# Patient Record
Sex: Male | Born: 1970 | Race: White | Hispanic: No | Marital: Married | Smoking: Current every day smoker
Health system: Southern US, Community
[De-identification: ages and names within clinical notes are randomized; demographics above are authoritative.]

## PROBLEM LIST (undated history)

## (undated) DIAGNOSIS — K219 Gastro-esophageal reflux disease without esophagitis: Secondary | ICD-10-CM

## (undated) DIAGNOSIS — E119 Type 2 diabetes mellitus without complications: Secondary | ICD-10-CM

## (undated) DIAGNOSIS — Z87442 Personal history of urinary calculi: Secondary | ICD-10-CM

## (undated) DIAGNOSIS — L508 Other urticaria: Secondary | ICD-10-CM

## (undated) DIAGNOSIS — R609 Edema, unspecified: Secondary | ICD-10-CM

## (undated) DIAGNOSIS — I10 Essential (primary) hypertension: Secondary | ICD-10-CM

## (undated) HISTORY — PX: SHOULDER ARTHROSCOPY: SHX128

## (undated) HISTORY — PX: ABDOMINAL SURGERY: SHX537

---
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
## 2004-04-25 ENCOUNTER — Emergency Department: Payer: Self-pay | Admitting: Emergency Medicine

## 2004-08-07 ENCOUNTER — Emergency Department: Payer: Self-pay | Admitting: Emergency Medicine

## 2005-04-23 ENCOUNTER — Emergency Department: Payer: Self-pay | Admitting: Unknown Physician Specialty

## 2005-11-14 ENCOUNTER — Other Ambulatory Visit: Payer: Self-pay

## 2005-11-14 ENCOUNTER — Emergency Department: Payer: Self-pay | Admitting: Emergency Medicine

## 2006-03-18 ENCOUNTER — Other Ambulatory Visit: Payer: Self-pay

## 2006-03-18 ENCOUNTER — Emergency Department: Payer: Self-pay | Admitting: Emergency Medicine

## 2006-11-17 ENCOUNTER — Emergency Department: Payer: Self-pay | Admitting: Internal Medicine

## 2007-03-02 ENCOUNTER — Emergency Department: Payer: Self-pay | Admitting: Emergency Medicine

## 2007-11-30 ENCOUNTER — Emergency Department: Payer: Self-pay | Admitting: Emergency Medicine

## 2007-11-30 ENCOUNTER — Other Ambulatory Visit: Payer: Self-pay

## 2007-12-11 ENCOUNTER — Ambulatory Visit: Payer: Self-pay | Admitting: Surgery

## 2008-01-10 ENCOUNTER — Ambulatory Visit: Payer: Self-pay | Admitting: Family Medicine

## 2008-02-29 ENCOUNTER — Ambulatory Visit: Payer: Self-pay | Admitting: Internal Medicine

## 2008-05-02 ENCOUNTER — Ambulatory Visit: Payer: Self-pay | Admitting: Family Medicine

## 2008-06-17 HISTORY — PX: SHOULDER ARTHROSCOPY: SHX128

## 2009-01-26 ENCOUNTER — Ambulatory Visit (HOSPITAL_BASED_OUTPATIENT_CLINIC_OR_DEPARTMENT_OTHER): Admission: RE | Admit: 2009-01-26 | Discharge: 2009-01-27 | Payer: Self-pay | Admitting: Orthopedic Surgery

## 2010-02-05 ENCOUNTER — Emergency Department: Payer: Self-pay | Admitting: Emergency Medicine

## 2010-03-25 ENCOUNTER — Emergency Department: Payer: Self-pay | Admitting: Emergency Medicine

## 2010-04-22 ENCOUNTER — Emergency Department: Payer: Self-pay | Admitting: Emergency Medicine

## 2010-09-05 ENCOUNTER — Ambulatory Visit: Payer: Self-pay | Admitting: Family Medicine

## 2010-09-23 LAB — POCT HEMOGLOBIN-HEMACUE: Hemoglobin: 14.8 g/dL (ref 13.0–17.0)

## 2010-10-30 NOTE — Op Note (Signed)
NAMEJUSITN, SALSGIVER NO.:  [id number]   MEDICAL RECORD NO.:  [id number]          PATIENT TYPE:  AMB   LOCATION:  DSC                          FACILITY:  [facility]   PHYSICIAN:  Katy Fitch. Sypher, M.D. DATE OF BIRTH:  Dec 24, 1970   DATE OF PROCEDURE:  01/26/2009  DATE OF DISCHARGE:                               OPERATIVE REPORT   PREOPERATIVE DIAGNOSIS:  Chronic pain syndrome right upper extremity  status post fall and alleged posterior dislocation right shoulder  sustained August 09, 2008 with persistent numbness, weakness and  mechanical symptoms of possible rotator cuff tear/instability right  shoulder.   POSTOPERATIVE DIAGNOSES:  Partial bursal side tear due to chronic stage  II impingement right shoulder and apparent healing of articular side  tear of supraspinatus tendon documented on preoperative MRI dated October 06, 2008.  Also acromioclavicular separation with acromioclavicular  arthropathy and prominent medial acromial and distal clavicle  osteophytes.   OPERATION:  1. Examination of right shoulder under anesthesia demonstrating      capsuloligamentous stability.  2. Diagnostic arthroscopy confirming intact labrum and      capsuloligamentous structures as well as healed articular side tear      of supraspinatus rotator cuff tendon and intact biceps labral      complex.  3. Arthroscopic subacromial decompression with bursectomy, debridement      of grade 2 impingement related partial-thickness rotator cuff tear      of supraspinatus anteriorly and medial acromioplasty.  4. Arthroscopic resection of distal clavicle.   SURGEON:  Katy Fitch. Sypher, MD   ASSISTANT:  Annye Rusk, PA-C   ANESTHESIA:  General endotracheal supplemented by right interscalene  block.   SUPERVISED ANESTHESIOLOGIST:  Janetta Hora. 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   On August 09, 2008 he lost his balance on a ladder fell approximately  15 feet while repairing damage to a gutter catching his full weight on  the right hand and applying a traction type strain to the right  shoulder.  He had immediate pain and was brought to the [facility]gent [facility] in [place] where the clinician on call for the Urgent Care  Center identified a posterior dislocation of the right shoulder.   After sedation and pain medication, Mr. Samples underwent a closed  reduction maneuver and was placed in a sling.  He was advised to  followup with his orthopedic surgeon in [place], [place].   He subsequently saw Dr. Boyce Medici who initially treated him for pain  and initiated a therapy program.   Mr. Severs continued to complain of numbness, therefore,  electrodiagnostic studies were obtained by Dr. Thomasena Edis of [facility].  A detailed EMG of the right and left upper  extremities revealed no sign of brachial plexopathy or abnormal muscle  activity suggestive of radiculopathy.  Nerve conduction studies  demonstrated evidence of bilateral carpal tunnel syndrome.   Dr. Thomasena Edis report is a bit difficult to interpret in that a  final  summary was not transcribed or at least was not referred to the [facility]  Compensation entity.   With persistent shoulder pain, Dr. Kennith Center ordered an MRI of the right  shoulder on October 06, 2008.  This was interpreted by the attending  radiologist to reveal a partial undersurface tear of the supraspinatus  tendon without retracted full-thickness tear and edema at the [facility] joint  ligamentous thickening and likely a grade 2 separation.   Mr. Beckner has been unable to return to work after 6 months and has had  persistent pain including numbness, tingling and weakness of the right  upper extremity.  On January 16, 2009 an independent medical evaluation  was requested regarding his persistent right upper extremity pain.   Clinical  examination was remarkable for a popping that was both palpable  and audible with abduction of the shoulder through 80-120 degrees  beneath the acromion and coracoacromial ligament that was likely  originating at the [facility] joint.  Plain films and an MRI of the shoulder  were reviewed in detail.  The [facility] joint was widened with degenerative  change and degenerative osteophyte formation at the medial acromion and  distal clavicle.  There were clearly mechanical symptoms with strain of  the supraspinatus, infraspinatus and rotator cuff tendons.   In light of the 54-month history of pain, mechanical popping and the  abnormal appearance of the [facility] joint, we recommended proceeding with  diagnostic arthroscopy.   A secondary goal of the arthroscopy would be to confirm the  capsuloligamentous structures following the alleged posterior  dislocation.   We advised Mr. Acuna to proceed with diagnostic arthroscopy anticipating  subacromial decompression, clavicle resection, and possible repair of  partial-thickness rotator cuff tear.  Also in our preoperative  discussion we advised Mr. Keir that he had features of neuropathic pain  and atypical pain that may have absolutely no therapeutic response to a  diagnostic arthroscopy of the shoulder.   He understands that we may need to have him evaluated by a pain  management specialist.  We may need to have him on medication for  neuropathic pain and might require further diagnostic efforts.   Our goal at this time was to address his mechanical shoulder symptoms,  as well as signs of a partial rotator cuff tear.  After informed  consent, he was brought to the operating room at this time.  Preoperatively, he was interviewed by Dr. Gelene Mink who recommended an  interscalene block.  This was placed without complication.   PROCEDURE:  BJORN HALLAS was brought to the operating room and placed in  supine position upon the operating table.  Following a proper  site  identification and protocol in the holding area, he was transferred to  the operating table where under Dr. Thornton Dales direct supervision  general endotracheal anesthesia was induced.  He was carefully  positioned in the beach-chair position with aid of a torso and head  holder designed for shoulder arthroscopy.  Under general anesthesia, I  tested his shoulder in all planes of motion.  He had combined elevation  175 degrees, external rotation 95 degrees, internal rotation 80.  He was  stable to anterior posterior, superior inferior traction and  translational stress.  He did not appear to have subluxing shoulder.   The right upper extremity was then prepped with DuraPrep and draped with  impervious arthroscopy drapes.  The shoulder was distended with 20 mL of  sterile saline and the scope placed through a  standard posterior viewing  portal with blunt technique.  Diagnostic arthroscopy revealed a pristine  anterior labrum and capsuloligamentous structures.  The biceps origin  was stable to superior labrum.  The biceps tendon was normal through the  rotator interval.  The subscapularis had a slight bulge at the superior  fibers that could have represented a small degenerative tear, however,  there was no sign of a significant retracted subscapularis rotator cuff  tear.  The articular surface of the supraspinatus, infraspinatus, and  teres minor was inspected and found to be pristine.  The findings noted  on the MRI in April appeared to have healed on the articular side.   The hyaline cartilage surfaces of the glenoid and humeral head were  normal.   The scope was removed from glenohumeral joint and placed in subacromial  space.  There was a moderate degree of hypertrophic bursitis noted.  The  [facility] joint was very prominent with a thickened and exophytic capsule.  The  supraspinatus tendon had a swayed like roughening of its surface and  partial tearing due to chronic abrasion  beneath the [facility] joint and  osteophytes.  This was likely the source of the palpable crepitation  with elevation and abduction movement.   The suction shaver was used to clear the bursa off the acromion and  distal clavicle followed by use of the cutting cautery to release the  capsule.  A suction bur was used to remove the redundant capsule  followed by use of a suction bur to resect the medial acromial  osteophyte converting the acromion to a level type 1 morphology and the  distal 6-7 mm clavicle was removed from inferior to superior followed by  undersurface tailoring of the clavicle to a smooth chord.   We relieved the site of possible impingement at the distal clavicle and  medial acromion followed by continued bursectomy.  The redundant and  rough margins of the supraspinatus tendon were smoothed with a 4.5-mm  suction shaver.   Hemostasis was achieved with bipolar cautery.   I did not identify any evidence of a full-thickness rotator cuff tear.   The arthroscopic equipment was removed and the portals repaired.  Mr.  Pfeifer was placed in a sling and transferred to the recovery room with  stable signs.   We will begin immediate range of motion exercises now that we understand  his stable joint and limited rotator cuff pathology.   If his pain syndrome persists, we would engage in a workup and  management program for neuropathic pain.   There were no apparent complications.      Katy Fitch Sypher, M.D.  Electronically Signed     RVS/MEDQ  D:  01/26/2009  T:  01/27/2009  Job:  [id number]

## 2010-11-20 ENCOUNTER — Emergency Department: Payer: Self-pay | Admitting: Emergency Medicine

## 2011-05-19 ENCOUNTER — Emergency Department: Payer: Self-pay | Admitting: *Deleted

## 2012-01-18 ENCOUNTER — Emergency Department: Payer: Self-pay | Admitting: *Deleted

## 2012-01-18 LAB — CBC
HCT: 46.9 % (ref 40.0–52.0)
HGB: 16 g/dL (ref 13.0–18.0)
MCH: 30.5 pg (ref 26.0–34.0)
MCHC: 34.1 g/dL (ref 32.0–36.0)
MCV: 89 fL (ref 80–100)
Platelet: 305 10*3/uL (ref 150–440)
RBC: 5.24 10*6/uL (ref 4.40–5.90)
RDW: 14.1 % (ref 11.5–14.5)
WBC: 12.6 10*3/uL — ABNORMAL HIGH (ref 3.8–10.6)

## 2012-01-18 LAB — URINALYSIS, COMPLETE
Bacteria: NONE SEEN
Bilirubin,UR: NEGATIVE
Glucose,UR: NEGATIVE mg/dL (ref 0–75)
Hyaline Cast: 2
Ketone: NEGATIVE
Leukocyte Esterase: NEGATIVE
Nitrite: NEGATIVE
Ph: 7 (ref 4.5–8.0)
Protein: 30
RBC,UR: 844 /HPF (ref 0–5)
Specific Gravity: 1.017 (ref 1.003–1.030)
Squamous Epithelial: <1
WBC UR: 7 /HPF (ref 0–5)

## 2012-01-18 LAB — COMPREHENSIVE METABOLIC PANEL
Albumin: 4.1 g/dL (ref 3.4–5.0)
Alkaline Phosphatase: 110 U/L (ref 50–136)
Anion Gap: 11 (ref 7–16)
BUN: 18 mg/dL (ref 7–18)
Bilirubin,Total: 0.3 mg/dL (ref 0.2–1.0)
Calcium, Total: 9.1 mg/dL (ref 8.5–10.1)
Chloride: 104 mmol/L (ref 98–107)
Co2: 24 mmol/L (ref 21–32)
Creatinine: 1.48 mg/dL — ABNORMAL HIGH (ref 0.60–1.30)
EGFR (African American): >60
EGFR (Non-African Amer.): 58 — ABNORMAL LOW
Glucose: 112 mg/dL — ABNORMAL HIGH (ref 65–99)
Osmolality: 280 (ref 275–301)
Potassium: 3.4 mmol/L — ABNORMAL LOW (ref 3.5–5.1)
SGOT(AST): 25 U/L (ref 15–37)
SGPT (ALT): 21 U/L (ref 12–78)
Sodium: 139 mmol/L (ref 136–145)
Total Protein: 8.2 g/dL (ref 6.4–8.2)

## 2012-01-18 LAB — LIPASE, BLOOD: Lipase: 106 U/L (ref 73–393)

## 2012-05-16 ENCOUNTER — Emergency Department: Payer: Self-pay | Admitting: Unknown Physician Specialty

## 2012-06-30 ENCOUNTER — Emergency Department: Payer: Self-pay | Admitting: Emergency Medicine

## 2013-01-13 ENCOUNTER — Emergency Department: Payer: Self-pay | Admitting: Emergency Medicine

## 2013-05-15 ENCOUNTER — Emergency Department: Payer: Self-pay | Admitting: Emergency Medicine

## 2013-08-18 DIAGNOSIS — L509 Urticaria, unspecified: Secondary | ICD-10-CM | POA: Insufficient documentation

## 2013-08-18 DIAGNOSIS — L508 Other urticaria: Secondary | ICD-10-CM | POA: Insufficient documentation

## 2013-08-18 DIAGNOSIS — F17200 Nicotine dependence, unspecified, uncomplicated: Secondary | ICD-10-CM | POA: Insufficient documentation

## 2013-08-18 HISTORY — DX: Nicotine dependence, unspecified, uncomplicated: F17.200

## 2014-08-16 ENCOUNTER — Emergency Department: Payer: Self-pay | Admitting: Emergency Medicine

## 2015-04-11 DIAGNOSIS — I10 Essential (primary) hypertension: Secondary | ICD-10-CM | POA: Insufficient documentation

## 2018-10-16 ENCOUNTER — Other Ambulatory Visit: Payer: Self-pay

## 2018-10-16 DIAGNOSIS — M25562 Pain in left knee: Secondary | ICD-10-CM | POA: Insufficient documentation

## 2018-10-16 DIAGNOSIS — Y990 Civilian activity done for income or pay: Secondary | ICD-10-CM | POA: Insufficient documentation

## 2018-10-16 DIAGNOSIS — Y9289 Other specified places as the place of occurrence of the external cause: Secondary | ICD-10-CM | POA: Insufficient documentation

## 2018-10-16 DIAGNOSIS — X500XXA Overexertion from strenuous movement or load, initial encounter: Secondary | ICD-10-CM | POA: Diagnosis not present

## 2018-10-16 DIAGNOSIS — M25462 Effusion, left knee: Secondary | ICD-10-CM | POA: Insufficient documentation

## 2018-10-16 DIAGNOSIS — Z79899 Other long term (current) drug therapy: Secondary | ICD-10-CM | POA: Diagnosis not present

## 2018-10-16 DIAGNOSIS — Y9302 Activity, running: Secondary | ICD-10-CM | POA: Insufficient documentation

## 2018-10-16 DIAGNOSIS — F17200 Nicotine dependence, unspecified, uncomplicated: Secondary | ICD-10-CM | POA: Diagnosis not present

## 2018-10-17 ENCOUNTER — Emergency Department: Payer: Worker's Compensation

## 2018-10-17 ENCOUNTER — Emergency Department
Admission: EM | Admit: 2018-10-17 | Discharge: 2018-10-17 | Disposition: A | Discharge status: Home / Self Care | Payer: Worker's Compensation | Attending: Student in an Organized Health Care Education/Training Program | Admitting: Student in an Organized Health Care Education/Training Program

## 2018-10-17 ENCOUNTER — Other Ambulatory Visit: Payer: Self-pay

## 2018-10-17 ENCOUNTER — Encounter: Payer: Self-pay | Admitting: Emergency Medicine

## 2018-10-17 DIAGNOSIS — M25562 Pain in left knee: Secondary | ICD-10-CM

## 2018-10-17 DIAGNOSIS — M25462 Effusion, left knee: Secondary | ICD-10-CM

## 2018-10-17 HISTORY — DX: Other urticaria: L50.8

## 2018-10-17 HISTORY — DX: Edema, unspecified: R60.9

## 2018-10-17 MED ORDER — HYDROCODONE-ACETAMINOPHEN 5-325 MG PO TABS
1.0000 | ORAL_TABLET | Freq: Once | ORAL | Status: AC
  Administered 2018-10-17: 1 via ORAL
  Filled 2018-10-17: qty 1

## 2018-10-17 MED ORDER — HYDROCODONE-ACETAMINOPHEN 5-325 MG PO TABS: 1.0000 | ORAL_TABLET | ORAL | 0 refills | Status: DC | PRN

## 2018-10-17 NOTE — ED Notes (Signed)
Pt updated on wait time and is understanding.  

## 2018-10-17 NOTE — ED Triage Notes (Signed)
Pt says he twisted his left knee while running from a hornet at his job today; pain with any movement or ambulation; swelling present;

## 2018-10-17 NOTE — ED Provider Notes (Signed)
[facility] Emergency Department Provider Note    First MD Initiated Contact with Patient 10/17/18 [phone]     (approximate)  I have reviewed the triage vital signs and the nursing notes.   HISTORY  Chief Complaint Knee Pain    HPI Perry Hansen is a 48 y.o. male below listed past medical history presents the ER for evaluation of acute left knee pain that occurred today while he was at work.  States he was running away from a hornet's nest and when making a turn while running felt and heard a pop in his left knee with sudden onset moderate to severe pain.  Is he went home was able to hobble on the left knee for the rest of the day but did have pain with weightbearing.  Denies any numbness or tingling.  States he feels like it is little bit more swollen.  No fevers.  Past Medical History:  Diagnosis Date  . Chronic urticaria   . Edema    History reviewed. No pertinent family history. Past Surgical History:  Procedure Laterality Date  . ABDOMINAL SURGERY    . SHOULDER ARTHROSCOPY Right    There are no active problems to display for this patient.     Prior to Admission medications   Medication Sig Start Date End Date Taking? Authorizing Provider  hydrochlorothiazide (HYDRODIURIL) 25 MG tablet Take 25 mg by mouth daily.   Yes [provider]  predniSONE (DELTASONE) 10 MG tablet Take 10 mg by mouth daily with breakfast.   Yes [provider]  HYDROcodone-acetaminophen (NORCO) 5-325 MG tablet Take 1 tablet by mouth every 4 (four) hours as needed for moderate pain. 10/17/18   Willy Eddy, MD    Allergies Naproxen    Social History Social History   Tobacco Use  . Smoking status: Current Every Day Smoker  . Smokeless tobacco: Never Used  Substance Use Topics  . Alcohol use: Yes  . Drug use: Never    Review of Systems Patient denies headaches, rhinorrhea, blurry vision, numbness, shortness of breath, chest pain, edema,  cough, abdominal pain, nausea, vomiting, diarrhea, dysuria, fevers, rashes or hallucinations unless otherwise stated above in HPI. ____________________________________________   PHYSICAL EXAM:  VITAL SIGNS: Vitals:   10/17/18 0014 10/17/18 0350  BP: (!) 182/88 (!) 181/94  Pulse: 81 73  Resp: 18 17  Temp: 97.9 F (36.6 C) 98 F (36.7 C)  SpO2: 97% 97%    Constitutional: Alert and oriented. Well appearing and in no acute distress. Eyes: Conjunctivae are normal.  Head: Atraumatic. Nose: No congestion/rhinnorhea. Mouth/Throat: Mucous membranes are moist.   Neck: Painless ROM.  Cardiovascular:   Good peripheral circulation. Respiratory: Normal respiratory effort.  No retractions.  Gastrointestinal: Soft and nontender.  Musculoskeletal: Trace left knee effusion.  Able to hold leg up in extension against gravity.  No overlying erythema laceration neurovascularly intact distally.  There is pain reproduced with valgus and varus stress testing.  No overlying crepitus. Neurologic:  Normal speech and language. No gross focal neurologic deficits are appreciated.  Skin:  Skin is warm, dry and intact. No rash noted. Psychiatric: Mood and affect are normal. Speech and behavior are normal.  ____________________________________________   LABS (all labs ordered are listed, but only abnormal results are displayed)  No results found for this or any previous visit (from the past 24 hour(s)). ____________________________________________ ____________________________________________  RADIOLOGY  I personally reviewed all radiographic images ordered to evaluate for the above acute complaints and reviewed  radiology reports and findings.  These findings were personally discussed with the patient.  Please see medical record for radiology report.  ____________________________________________   PROCEDURES  Procedure(s) performed:  Procedures    Critical Care performed: no  ____________________________________________   INITIAL IMPRESSION / ASSESSMENT AND PLAN / ED COURSE  Pertinent labs & imaging results that were available during my care of the patient were reviewed by me and considered in my medical decision making (see chart for details).  DDX: fracture, dislocation, contusion, ligamentous injury, meniscal injury  Perry Hansen is a 48 y.o. who presents to the ED with acute left knee injury. Denies any other injuries. Denies motor or sensory loss. Able to bear weight. VSS in ED. Exam as above. NV intact throughout and distal to injury. Pt able to range joint. No ligament laxity on exam. No clinical suspicion for infectious process or septic joint. X-rays w/o fracture. No other injuries reported or noted on exam.. Discussed supportive care, crutches, nonweightbearing, immobilizer and follow up with pt.  The patient was evaluated in Emergency Department today for the symptoms described in the history of present illness. He/she was evaluated in the context of the global COVID-19 pandemic, which necessitated consideration that the patient might be at risk for infection with the SARS-CoV-2 virus that causes COVID-19. Institutional protocols and algorithms that pertain to the evaluation of patients at risk for COVID-19 are in a state of rapid change based on information released by regulatory bodies including the CDC and federal and state organizations. These policies and algorithms were followed during the patient's care in the ED.      ____________________________________________   FINAL CLINICAL IMPRESSION(S) / ED DIAGNOSES  Final diagnoses:  Acute pain of left knee  Effusion of left knee      NEW MEDICATIONS STARTED DURING THIS VISIT:  New Prescriptions   HYDROCODONE-ACETAMINOPHEN (NORCO) 5-325 MG TABLET    Take 1 tablet by mouth every 4 (four) hours as needed for moderate pain.     Note:  This document was prepared using Dragon voice recognition  software and may include unintentional dictation errors.     Willy Eddyobinson, Bailynn Dyk, MD 10/17/18 [phone]

## 2019-01-11 ENCOUNTER — Emergency Department: Payer: Worker's Compensation

## 2019-01-11 ENCOUNTER — Emergency Department
Admission: EM | Admit: 2019-01-11 | Discharge: 2019-01-11 | Disposition: A | Discharge status: Home / Self Care | Payer: Worker's Compensation | Attending: Emergency Medicine | Admitting: Emergency Medicine

## 2019-01-11 ENCOUNTER — Encounter: Payer: Self-pay | Admitting: Emergency Medicine

## 2019-01-11 ENCOUNTER — Other Ambulatory Visit: Payer: Self-pay

## 2019-01-11 DIAGNOSIS — W010XXA Fall on same level from slipping, tripping and stumbling without subsequent striking against object, initial encounter: Secondary | ICD-10-CM | POA: Insufficient documentation

## 2019-01-11 DIAGNOSIS — M79662 Pain in left lower leg: Secondary | ICD-10-CM | POA: Insufficient documentation

## 2019-01-11 DIAGNOSIS — M25562 Pain in left knee: Secondary | ICD-10-CM

## 2019-01-11 DIAGNOSIS — M79652 Pain in left thigh: Secondary | ICD-10-CM | POA: Insufficient documentation

## 2019-01-11 DIAGNOSIS — F17200 Nicotine dependence, unspecified, uncomplicated: Secondary | ICD-10-CM | POA: Insufficient documentation

## 2019-01-11 MED ORDER — OXYCODONE-ACETAMINOPHEN 5-325 MG PO TABS
1.0000 | ORAL_TABLET | Freq: Once | ORAL | Status: AC
  Administered 2019-01-11: 1 via ORAL
  Filled 2019-01-11: qty 1

## 2019-01-11 MED ORDER — OXYCODONE-ACETAMINOPHEN 5-325 MG PO TABS: 1.0000 | ORAL_TABLET | ORAL | 0 refills | Status: DC | PRN

## 2019-01-11 NOTE — ED Triage Notes (Signed)
Pt arrives via [facility] with c/o mechanical fall. Pt has a torn ACL in left leg and heard a pop. EMS reports VS WDL. Pt is in NAD.

## 2019-01-11 NOTE — ED Provider Notes (Signed)
[facility] Emergency Department Provider Note __   First MD Initiated Contact with Patient 01/11/19 0411     (approximate)  I have reviewed the triage vital signs and the nursing notes.   HISTORY  Chief Complaint Fall    HPI Perry Hansen is a 48 y.o. male presents emergency department via EMS after accidental fall with resultant left leg pain.  Patient has a known left knee ACL "complete tear" for which the patient is awaiting surgery and currently wearing a knee immobilizer.  Patient states that while walking with crutches tonight the crutches "came out from under me resulting the patient falling.  Patient admits to left knee/lateral thigh pain.  Patient states that current pain score 7 out of 10 worse with any movement.       Past Medical History:  Diagnosis Date  . Chronic urticaria   . Edema     There are no active problems to display for this patient.   Past Surgical History:  Procedure Laterality Date  . ABDOMINAL SURGERY    . SHOULDER ARTHROSCOPY Right     Prior to Admission medications   Medication Sig Start Date End Date Taking? Authorizing Provider  hydrochlorothiazide (HYDRODIURIL) 25 MG tablet Take 25 mg by mouth daily.    [provider]  HYDROcodone-acetaminophen (NORCO) 5-325 MG tablet Take 1 tablet by mouth every 4 (four) hours as needed for moderate pain. 10/17/18   Merlyn Lot, MD  predniSONE (DELTASONE) 10 MG tablet Take 10 mg by mouth daily with breakfast.    [provider]    Allergies Naproxen  No family history on file.  Social History Social History   Tobacco Use  . Smoking status: Current Every Day Smoker  . Smokeless tobacco: Never Used  Substance Use Topics  . Alcohol use: Yes  . Drug use: Never    Review of Systems Constitutional: No fever/chills Eyes: No visual changes. ENT: No sore throat. Cardiovascular: Denies chest pain. Respiratory: Denies shortness of breath.  Gastrointestinal: No abdominal pain.  No nausea, no vomiting.  No diarrhea.  No constipation. Genitourinary: Negative for dysuria. Musculoskeletal:  Positive for left thigh/knee pain  integumentary: Negative for rash. Neurological: Negative for headaches, focal weakness or numbness.   ____________________________________________   PHYSICAL EXAM:  VITAL SIGNS: ED Triage Vitals  Enc Vitals Group     BP 01/11/19 0409 (!) 152/100     Pulse Rate 01/11/19 0409 86     Resp 01/11/19 0409 (!) 22     Temp 01/11/19 0409 98.8 F (37.1 C)     Temp Source 01/11/19 0409 Oral     SpO2 01/11/19 0409 98 %     Weight 01/11/19 0407 104.3 kg (230 lb)     Height 01/11/19 0407 1.727 m (5\' 8" )     Head Circumference --      Peak Flow --      Pain Score 01/11/19 0406 7     Pain Loc --      Pain Edu? --      Excl. in [place]? --     Constitutional: Alert and oriented. Well appearing and in no acute distress. Eyes: Conjunctivae are normal. Head: Atraumatic. Mouth/Throat: Mucous membranes are moist.  Oropharynx non-erythematous. Neck: No stridor.   Cardiovascular: Normal rate, regular rhythm. Good peripheral circulation. Grossly normal heart sounds. Respiratory: Normal respiratory effort.  No retractions. No audible wheezing. Gastrointestinal: Soft and nontender. No distention.  Musculoskeletal: Lateral left thigh pain with gentle  palpation superior to the knee immobilizer.  Pain with anterior palpation of the left knee. Neurologic:  Normal speech and language. No gross focal neurologic deficits are appreciated.  Skin:  Skin is warm, dry and intact. No rash noted. Psychiatric: Mood and affect are____________  RADIOLOGY I, [place] Dewayne ShorterN Natasha Burda, personally viewed and evaluated these images (plain radiographs) as part of my medical decision making, as well as reviewing the written report by the radiologist.  ED MD interpretation: Negative left femur x-ray per radiologist.  Left knee x-ray revealed a small  joint effusion without any osseous abnormality per radiologist.   Official radiology report(s): Dg Knee Complete 4 Views Left  Result Date: 01/11/2019 CLINICAL DATA:  Mechanical fall. EXAM: LEFT KNEE - COMPLETE 4+ VIEW COMPARISON:  10/17/2018 FINDINGS: Small joint effusion. No fracture or malalignment. No degenerative spurring. IMPRESSION: Small joint effusion without osseous abnormality. Electronically Signed   By: Marnee SpringJonathon  Watts M.D.   On: 01/11/2019 05:05   Dg Femur Min 2 Views Left  Result Date: 01/11/2019 CLINICAL DATA:  Mechanical fall EXAM: LEFT FEMUR 2 VIEWS COMPARISON:  None. FINDINGS: Small knee joint effusion as described on dedicated knee study. No fracture, malalignment, or degenerative spurring. IMPRESSION: Negative left femur. Electronically Signed   By: Marnee SpringJonathon  Watts M.D.   On: 01/11/2019 05:29    Procedures   ____________________________________________   INITIAL IMPRESSION / MDM / ASSESSMENT AND PLAN / ED COURSE  As part of my medical decision making, I reviewed the following data within the electronic MEDICAL RECORD NUMBER   48 year old male presenting with above-stated history and physical exam following accidental fall with resultant left thigh pain.  Patient refused any IV/IM pain medication and as such patient was given p.o. Percocet in the emergency department.  X-ray revealed no evidence of fracture or dislocation.  Recommended outpatient follow-up with orthopedics for possible repeat MRI for possible additional ligamentous injury.      ____________________________________________  FINAL CLINICAL IMPRESSION(S) / ED DIAGNOSES  Final diagnoses:  Acute pain of left knee     MEDICATIONS GIVEN DURING THIS VISIT:  Medications  oxyCODONE-acetaminophen (PERCOCET/ROXICET) 5-325 MG per tablet 1 tablet (1 tablet Oral Given 01/11/19 0509)     ED Discharge Orders    None      *Please note:  [name] was evaluated in Emergency Department on 01/11/2019 for  the symptoms described in the history of present illness. He was evaluated in the context of the global COVID-19 pandemic, which necessitated consideration that the patient might be at risk for infection with the SARS-CoV-2 virus that causes COVID-19. Institutional protocols and algorithms that pertain to the evaluation of patients at risk for COVID-19 are in a state of rapid change based on information released by regulatory bodies including the CDC and federal and state organizations. These policies and algorithms were followed during the patient's care in the ED.  Some ED evaluations and interventions may be delayed as a result of limited staffing during the pandemic.*  Note:  This document was prepared using Dragon voice recognition software and may include unintentional dictation errors.   Darci CurrentBrown, [place] N, MD 01/11/19 [phone]

## 2019-02-15 ENCOUNTER — Other Ambulatory Visit: Payer: Self-pay | Admitting: Orthopedic Surgery

## 2019-02-24 ENCOUNTER — Other Ambulatory Visit: Payer: Self-pay

## 2019-02-24 ENCOUNTER — Encounter
Admission: RE | Admit: 2019-02-24 | Discharge: 2019-02-24 | Disposition: A | Discharge status: Home / Self Care | Payer: Worker's Compensation | Source: Ambulatory Visit | Attending: Orthopedic Surgery | Admitting: Orthopedic Surgery

## 2019-02-24 DIAGNOSIS — Z01812 Encounter for preprocedural laboratory examination: Secondary | ICD-10-CM | POA: Diagnosis present

## 2019-02-24 DIAGNOSIS — I10 Essential (primary) hypertension: Secondary | ICD-10-CM | POA: Diagnosis not present

## 2019-02-24 HISTORY — DX: Essential (primary) hypertension: I10

## 2019-02-24 HISTORY — DX: Gastro-esophageal reflux disease without esophagitis: K21.9

## 2019-02-24 LAB — BASIC METABOLIC PANEL
Anion gap: 11 (ref 5–15)
BUN: 16 mg/dL (ref 6–20)
CO2: 27 mmol/L (ref 22–32)
Calcium: 9.3 mg/dL (ref 8.9–10.3)
Chloride: 99 mmol/L (ref 98–111)
Creatinine, Ser: 1.04 mg/dL (ref 0.61–1.24)
GFR calc Af Amer: >60 mL/min (ref >60)
GFR calc non Af Amer: >60 mL/min (ref >60)
Glucose, Bld: 144 mg/dL — ABNORMAL HIGH (ref 70–99)
Potassium: 3.3 mmol/L — ABNORMAL LOW (ref 3.5–5.1)
Sodium: 137 mmol/L (ref 135–145)

## 2019-02-24 LAB — CBC WITH DIFFERENTIAL/PLATELET
Abs Immature Granulocytes: 0.05 10*3/uL (ref 0.00–0.07)
Basophils Absolute: 0.1 10*3/uL (ref 0.0–0.1)
Basophils Relative: 1 %
Eosinophils Absolute: 0 10*3/uL (ref 0.0–0.5)
Eosinophils Relative: 0 %
HCT: 45 % (ref 39.0–52.0)
Hemoglobin: 15.1 g/dL (ref 13.0–17.0)
Immature Granulocytes: 0 %
Lymphocytes Relative: 20 %
Lymphs Abs: 2.3 10*3/uL (ref 0.7–4.0)
MCH: 29.3 pg (ref 26.0–34.0)
MCHC: 33.6 g/dL (ref 30.0–36.0)
MCV: 87.4 fL (ref 80.0–100.0)
Monocytes Absolute: 0.6 10*3/uL (ref 0.1–1.0)
Monocytes Relative: 5 %
Neutro Abs: 8.7 10*3/uL — ABNORMAL HIGH (ref 1.7–7.7)
Neutrophils Relative %: 74 %
Platelets: 300 10*3/uL (ref 150–400)
RBC: 5.15 MIL/uL (ref 4.22–5.81)
RDW: 13.6 % (ref 11.5–15.5)
WBC: 11.7 10*3/uL — ABNORMAL HIGH (ref 4.0–10.5)
nRBC: 0 % (ref 0.0–0.2)

## 2019-02-24 LAB — PROTIME-INR
INR: 1 (ref 0.8–1.2)
Prothrombin Time: 12.9 seconds (ref 11.4–15.2)

## 2019-02-24 LAB — APTT: aPTT: 30 seconds (ref 24–36)

## 2019-02-24 NOTE — Patient Instructions (Signed)
Your procedure is scheduled on: 03/02/2019 Tues Report to Same Day Surgery 2nd floor medical mall [facility] Entrance-take elevator on left to 2nd floor.  Check in with surgery information desk.) To find out your arrival time please call [phone] between 1PM - 3PM on 03/01/2019 Mon  Remember: Instructions that are not followed completely may result in serious medical risk, up to and including death, or upon the discretion of your surgeon and anesthesiologist your surgery may need to be rescheduled.    _x___ 1. Do not eat food after midnight the night before your procedure. You may drink clear liquids up to 2 hours before you are scheduled to arrive at the hospital for your procedure.  Do not drink clear liquids within 2 hours of your scheduled arrival to the hospital.  Clear liquids include  --Water or Apple juice without pulp  --Clear carbohydrate beverage such as ClearFast or Gatorade  --Black Coffee or Clear Tea (No milk, no creamers, do not add anything to                  the coffee or Tea Type 1 and type 2 diabetics should only drink water.   ____Ensure clear carbohydrate drink on the way to the hospital for bariatric patients  ____Ensure clear carbohydrate drink 3 hours before surgery.   No gum chewing or hard candies.     __x__ 2. No Alcohol for 24 hours before or after surgery.   __x__3. No Smoking or e-cigarettes for 24 prior to surgery.  Do not use any chewable tobacco products for at least 6 hour prior to surgery   ____  4. Bring all medications with you on the day of surgery if instructed.    __x__ 5. Notify your doctor if there is any change in your medical condition     (cold, fever, infections).    x___6. On the morning of surgery brush your teeth with toothpaste and water.  You may rinse your mouth with mouth wash if you wish.  Do not swallow any toothpaste or mouthwash.   Do not wear jewelry, make-up, hairpins, clips or nail polish.  Do not wear lotions,  powders, or perfumes. You may wear deodorant.  Do not shave 48 hours prior to surgery. Men may shave face and neck.  Do not bring valuables to the hospital.    [employer] is not responsible for any belongings or valuables.               Contacts, dentures or bridgework may not be worn into surgery.  Leave your suitcase in the car. After surgery it may be brought to your room.  For patients admitted to the hospital, discharge time is determined by your                       treatment team.  _  Patients discharged the day of surgery will not be allowed to drive home.  You will need someone to drive you home and stay with you the night of your procedure.    Please read over the following fact sheets that you were given:   [facility] Preparing for Surgery and or MRSA Information   _x___ Take anti-hypertensive listed below, cardiac, seizure, asthma,     anti-reflux and psychiatric medicines. These include:  1. predniSONE (DELTASONE) 10 MG tablet if needed  2.  3.  4.  5.  6.  ____Fleets enema or Magnesium Citrate as directed.  _x___ Use CHG Soap or sage wipes as directed on instruction sheet   ____ Use inhalers on the day of surgery and bring to hospital day of surgery  ____ Stop Metformin and Janumet 2 days prior to surgery.    ____ Take 1/2 of usual insulin dose the night before surgery and none on the morning     surgery.   _x___ Follow recommendations from Cardiologist, Pulmonologist or PCP regarding          stopping Aspirin, Coumadin, Plavix ,Eliquis, Effient, or Pradaxa, and Pletal.  X____Stop Anti-inflammatories such as Advil, Aleve, Ibuprofen, Motrin, Naproxen, Naprosyn, Goodies powders or aspirin products. OK to take Tylenol and                          Celebrex.   _x___ Stop supplements until after surgery.  But may continue Vitamin D, Vitamin B,       and multivitamin.   ____ Bring C-Pap to the hospital.    

## 2019-02-26 ENCOUNTER — Other Ambulatory Visit: Payer: Self-pay

## 2019-02-26 ENCOUNTER — Other Ambulatory Visit
Admission: RE | Admit: 2019-02-26 | Discharge: 2019-02-26 | Disposition: A | Discharge status: Home / Self Care | Payer: Worker's Compensation | Source: Ambulatory Visit | Attending: Orthopedic Surgery | Admitting: Orthopedic Surgery

## 2019-02-26 DIAGNOSIS — Z01812 Encounter for preprocedural laboratory examination: Secondary | ICD-10-CM | POA: Diagnosis present

## 2019-02-26 DIAGNOSIS — Z20828 Contact with and (suspected) exposure to other viral communicable diseases: Secondary | ICD-10-CM | POA: Insufficient documentation

## 2019-02-27 LAB — SARS CORONAVIRUS 2 (TAT 6-24 HRS): SARS Coronavirus 2: NEGATIVE

## 2019-03-02 ENCOUNTER — Ambulatory Visit: Payer: Worker's Compensation | Admitting: Certified Registered"

## 2019-03-02 ENCOUNTER — Ambulatory Visit
Admit: 2019-03-02 | Discharge: 2019-03-02 | Disposition: A | Discharge status: Home / Self Care | Payer: Worker's Compensation | Source: Ambulatory Visit | Attending: Orthopedic Surgery | Admitting: Orthopedic Surgery

## 2019-03-02 ENCOUNTER — Other Ambulatory Visit: Payer: Self-pay

## 2019-03-02 ENCOUNTER — Encounter: Payer: Self-pay | Admitting: *Deleted

## 2019-03-02 ENCOUNTER — Encounter
Disposition: A | Discharge status: Home / Self Care | Payer: Self-pay | Source: Ambulatory Visit | Attending: Orthopedic Surgery

## 2019-03-02 DIAGNOSIS — Z79899 Other long term (current) drug therapy: Secondary | ICD-10-CM | POA: Insufficient documentation

## 2019-03-02 DIAGNOSIS — S83212A Bucket-handle tear of medial meniscus, current injury, left knee, initial encounter: Secondary | ICD-10-CM | POA: Diagnosis not present

## 2019-03-02 DIAGNOSIS — Y9339 Activity, other involving climbing, rappelling and jumping off: Secondary | ICD-10-CM | POA: Insufficient documentation

## 2019-03-02 DIAGNOSIS — Z886 Allergy status to analgesic agent status: Secondary | ICD-10-CM | POA: Insufficient documentation

## 2019-03-02 DIAGNOSIS — Z7952 Long term (current) use of systemic steroids: Secondary | ICD-10-CM | POA: Insufficient documentation

## 2019-03-02 DIAGNOSIS — I10 Essential (primary) hypertension: Secondary | ICD-10-CM | POA: Diagnosis not present

## 2019-03-02 DIAGNOSIS — L508 Other urticaria: Secondary | ICD-10-CM | POA: Insufficient documentation

## 2019-03-02 DIAGNOSIS — S83512A Sprain of anterior cruciate ligament of left knee, initial encounter: Secondary | ICD-10-CM | POA: Insufficient documentation

## 2019-03-02 DIAGNOSIS — Y990 Civilian activity done for income or pay: Secondary | ICD-10-CM | POA: Insufficient documentation

## 2019-03-02 DIAGNOSIS — F17210 Nicotine dependence, cigarettes, uncomplicated: Secondary | ICD-10-CM | POA: Insufficient documentation

## 2019-03-02 HISTORY — PX: ANTERIOR CRUCIATE LIGAMENT REPAIR: SHX115

## 2019-03-02 SURGERY — RECONSTRUCTION, KNEE, ACL
Anesthesia: General | Site: Knee | Laterality: Left

## 2019-03-02 MED ORDER — FAMOTIDINE 20 MG PO TABS
ORAL_TABLET | ORAL | Status: AC
  Administered 2019-03-02: 07:00:00 20 mg via ORAL
  Filled 2019-03-02: qty 1

## 2019-03-02 MED ORDER — PROMETHAZINE HCL 25 MG/ML IJ SOLN: 6.2500 mg | INTRAMUSCULAR | Status: DC | PRN

## 2019-03-02 MED ORDER — OXYCODONE HCL 5 MG/5ML PO SOLN: 5.0000 mg | Freq: Once | ORAL | Status: AC | PRN

## 2019-03-02 MED ORDER — ROCURONIUM BROMIDE 100 MG/10ML IV SOLN
INTRAVENOUS | Status: DC | PRN
  Administered 2019-03-02: 50 mg via INTRAVENOUS

## 2019-03-02 MED ORDER — PHENYLEPHRINE HCL (PRESSORS) 10 MG/ML IV SOLN
INTRAVENOUS | Status: AC
  Filled 2019-03-02: qty 1

## 2019-03-02 MED ORDER — LIDOCAINE HCL 4 % MT SOLN
OROMUCOSAL | Status: DC | PRN
  Administered 2019-03-02: 4 mL via TOPICAL

## 2019-03-02 MED ORDER — MIDAZOLAM HCL 2 MG/2ML IJ SOLN
INTRAMUSCULAR | Status: AC
  Filled 2019-03-02: qty 2

## 2019-03-02 MED ORDER — LIDOCAINE HCL (CARDIAC) PF 100 MG/5ML IV SOSY
PREFILLED_SYRINGE | INTRAVENOUS | Status: DC | PRN
  Administered 2019-03-02: 100 mg via INTRAVENOUS

## 2019-03-02 MED ORDER — ONDANSETRON HCL 4 MG/2ML IJ SOLN
INTRAMUSCULAR | Status: DC | PRN
  Administered 2019-03-02: 4 mg via INTRAVENOUS

## 2019-03-02 MED ORDER — NEOMYCIN-POLYMYXIN B GU 40-200000 IR SOLN
Status: AC
  Filled 2019-03-02: qty 2

## 2019-03-02 MED ORDER — BUPIVACAINE HCL (PF) 0.25 % IJ SOLN
INTRAMUSCULAR | Status: AC
  Filled 2019-03-02: qty 30

## 2019-03-02 MED ORDER — PROPOFOL 10 MG/ML IV BOLUS
INTRAVENOUS | Status: AC
  Filled 2019-03-02: qty 20

## 2019-03-02 MED ORDER — OXYCODONE HCL 5 MG PO TABS: 5.0000 mg | ORAL_TABLET | ORAL | 0 refills | Status: DC | PRN

## 2019-03-02 MED ORDER — CEFAZOLIN SODIUM-DEXTROSE 2-4 GM/100ML-% IV SOLN
INTRAVENOUS | Status: AC
  Filled 2019-03-02: qty 100

## 2019-03-02 MED ORDER — DEXMEDETOMIDINE HCL IN NACL 80 MCG/20ML IV SOLN
INTRAVENOUS | Status: AC
  Filled 2019-03-02: qty 20

## 2019-03-02 MED ORDER — CHLORHEXIDINE GLUCONATE CLOTH 2 % EX PADS: 6.0000 | MEDICATED_PAD | Freq: Once | CUTANEOUS | Status: DC

## 2019-03-02 MED ORDER — LIDOCAINE HCL (PF) 2 % IJ SOLN
INTRAMUSCULAR | Status: AC
  Filled 2019-03-02: qty 10

## 2019-03-02 MED ORDER — FENTANYL CITRATE (PF) 100 MCG/2ML IJ SOLN
INTRAMUSCULAR | Status: AC
  Administered 2019-03-02: 25 ug via INTRAVENOUS
  Filled 2019-03-02: qty 2

## 2019-03-02 MED ORDER — LACTATED RINGERS IV SOLN
INTRAVENOUS | Status: DC | PRN
  Administered 2019-03-02: 4 mL

## 2019-03-02 MED ORDER — NEOMYCIN-POLYMYXIN B GU 40-200000 IR SOLN
Status: DC | PRN
  Administered 2019-03-02: 2 mL

## 2019-03-02 MED ORDER — FENTANYL CITRATE (PF) 100 MCG/2ML IJ SOLN
25.0000 ug | INTRAMUSCULAR | Status: DC | PRN
  Administered 2019-03-02 (×4): 25 ug via INTRAVENOUS

## 2019-03-02 MED ORDER — EPHEDRINE SULFATE 50 MG/ML IJ SOLN
INTRAMUSCULAR | Status: AC
  Filled 2019-03-02: qty 1

## 2019-03-02 MED ORDER — DEXAMETHASONE SODIUM PHOSPHATE 10 MG/ML IJ SOLN
INTRAMUSCULAR | Status: AC
  Filled 2019-03-02: qty 1

## 2019-03-02 MED ORDER — HYDROMORPHONE HCL 1 MG/ML IJ SOLN
INTRAMUSCULAR | Status: DC | PRN
  Administered 2019-03-02 (×2): 0.5 mg via INTRAVENOUS

## 2019-03-02 MED ORDER — PROPOFOL 10 MG/ML IV BOLUS
INTRAVENOUS | Status: DC | PRN
  Administered 2019-03-02: 180 mg via INTRAVENOUS

## 2019-03-02 MED ORDER — MEPERIDINE HCL 50 MG/ML IJ SOLN: 6.2500 mg | INTRAMUSCULAR | Status: DC | PRN

## 2019-03-02 MED ORDER — ONDANSETRON HCL 4 MG/2ML IJ SOLN
INTRAMUSCULAR | Status: AC
  Filled 2019-03-02: qty 2

## 2019-03-02 MED ORDER — HYDROMORPHONE HCL 1 MG/ML IJ SOLN
INTRAMUSCULAR | Status: AC
  Filled 2019-03-02: qty 1

## 2019-03-02 MED ORDER — LIDOCAINE HCL 1 % IJ SOLN
INTRAMUSCULAR | Status: DC | PRN
  Administered 2019-03-02: 1 mL

## 2019-03-02 MED ORDER — EPHEDRINE SULFATE 50 MG/ML IJ SOLN
INTRAMUSCULAR | Status: DC | PRN
  Administered 2019-03-02: 10 mg via INTRAVENOUS

## 2019-03-02 MED ORDER — FAMOTIDINE 20 MG PO TABS
20.0000 mg | ORAL_TABLET | Freq: Once | ORAL | Status: AC
  Administered 2019-03-02: 07:00:00 20 mg via ORAL

## 2019-03-02 MED ORDER — EPINEPHRINE PF 1 MG/ML IJ SOLN
INTRAMUSCULAR | Status: AC
  Filled 2019-03-02: qty 4

## 2019-03-02 MED ORDER — ONDANSETRON HCL 4 MG PO TABS: 4.0000 mg | ORAL_TABLET | Freq: Three times a day (TID) | ORAL | 0 refills | Status: DC | PRN

## 2019-03-02 MED ORDER — FENTANYL CITRATE (PF) 100 MCG/2ML IJ SOLN
INTRAMUSCULAR | Status: DC | PRN
  Administered 2019-03-02: 50 ug via INTRAVENOUS
  Administered 2019-03-02 (×2): 100 ug via INTRAVENOUS

## 2019-03-02 MED ORDER — MIDAZOLAM HCL 2 MG/2ML IJ SOLN
INTRAMUSCULAR | Status: DC | PRN
  Administered 2019-03-02: 2 mg via INTRAVENOUS

## 2019-03-02 MED ORDER — OXYCODONE HCL 5 MG PO TABS
5.0000 mg | ORAL_TABLET | Freq: Once | ORAL | Status: AC | PRN
  Administered 2019-03-02: 5 mg via ORAL

## 2019-03-02 MED ORDER — LACTATED RINGERS IV SOLN
INTRAVENOUS | Status: DC
  Administered 2019-03-02: 20 mL/h via INTRAVENOUS

## 2019-03-02 MED ORDER — FENTANYL CITRATE (PF) 250 MCG/5ML IJ SOLN
INTRAMUSCULAR | Status: AC
  Filled 2019-03-02: qty 5

## 2019-03-02 MED ORDER — FENTANYL CITRATE (PF) 100 MCG/2ML IJ SOLN
INTRAMUSCULAR | Status: AC
  Filled 2019-03-02: qty 2

## 2019-03-02 MED ORDER — ROCURONIUM BROMIDE 50 MG/5ML IV SOLN
INTRAVENOUS | Status: AC
  Filled 2019-03-02: qty 1

## 2019-03-02 MED ORDER — DEXAMETHASONE SODIUM PHOSPHATE 10 MG/ML IJ SOLN
INTRAMUSCULAR | Status: DC | PRN
  Administered 2019-03-02: 8 mg via INTRAVENOUS

## 2019-03-02 MED ORDER — BUPIVACAINE HCL (PF) 0.25 % IJ SOLN
INTRAMUSCULAR | Status: DC | PRN
  Administered 2019-03-02: 20 mL

## 2019-03-02 MED ORDER — LIDOCAINE HCL (PF) 1 % IJ SOLN
INTRAMUSCULAR | Status: AC
  Filled 2019-03-02: qty 30

## 2019-03-02 MED ORDER — ASPIRIN EC 325 MG PO TBEC: 325.0000 mg | DELAYED_RELEASE_TABLET | Freq: Every day | ORAL | 0 refills | Status: DC

## 2019-03-02 MED ORDER — CEFAZOLIN SODIUM-DEXTROSE 2-4 GM/100ML-% IV SOLN
2.0000 g | INTRAVENOUS | Status: AC
  Administered 2019-03-02: 2 g via INTRAVENOUS

## 2019-03-02 MED ORDER — DEXMEDETOMIDINE HCL 200 MCG/2ML IV SOLN
INTRAVENOUS | Status: DC | PRN
  Administered 2019-03-02: 16 ug via INTRAVENOUS
  Administered 2019-03-02 (×2): 12 ug via INTRAVENOUS

## 2019-03-02 MED ORDER — OXYCODONE HCL 5 MG PO TABS
ORAL_TABLET | ORAL | Status: AC
  Filled 2019-03-02: qty 1

## 2019-03-02 SURGICAL SUPPLY — 106 items
ADAPTER IRRIG TUBE 2 SPIKE SOL (ADAPTER) ×6 IMPLANT
ANCHOR BUTTON TIGHTROPE ACL RT (Orthopedic Implant) ×2 IMPLANT
ANCHOR SUPER #2 ORTHOCORD (MISCELLANEOUS) IMPLANT
ANCHOR SUT BIO SW 4.75X19.1 (Anchor) ×2 IMPLANT
BASIN GRAD PLASTIC 32OZ STRL (MISCELLANEOUS) ×3 IMPLANT
BIT DRILL PIN RETRO (DRILL) IMPLANT
BLADE SURG 15 STRL LF DISP TIS (BLADE) ×1 IMPLANT
BLADE SURG 15 STRL SS (BLADE) ×2
BLADE SURG SZ11 CARB STEEL (BLADE) ×3 IMPLANT
BNDG COHESIVE 4X5 TAN STRL (GAUZE/BANDAGES/DRESSINGS) ×3 IMPLANT
BNDG COHESIVE 6X5 TAN STRL LF (GAUZE/BANDAGES/DRESSINGS) ×3 IMPLANT
BNDG ESMARK 6X12 TAN STRL LF (GAUZE/BANDAGES/DRESSINGS) IMPLANT
BUR RADIUS 3.5 (BURR) ×3 IMPLANT
BUR RADIUS 4.0X18.5 (BURR) ×3 IMPLANT
BUR RADIUS 5.5 (BURR) ×3 IMPLANT
CLEANER CAUTERY TIP 5X5 PAD (MISCELLANEOUS) ×1 IMPLANT
CLOSURE WOUND 1/2 X4 (GAUZE/BANDAGES/DRESSINGS) ×2
COOLER POLAR GLACIER W/PUMP (MISCELLANEOUS) ×3 IMPLANT
COVER BACK TABLE REUSABLE LG (DRAPES) ×3 IMPLANT
COVER WAND RF STERILE (DRAPES) ×3 IMPLANT
CUFF TOURN SGL QUICK 24 (TOURNIQUET CUFF)
CUFF TOURN SGL QUICK 30 (TOURNIQUET CUFF) ×2
CUFF TRNQT CYL 24X4X16.5-23 (TOURNIQUET CUFF) IMPLANT
CUFF TRNQT CYL 30X4X21-28X (TOURNIQUET CUFF) IMPLANT
CUTTER DUAL RETRO 9.5 STRL (CUTTER) ×2 IMPLANT
DRAPE 3/4 80X56 (DRAPES) ×6 IMPLANT
DRAPE FLUOR MINI C-ARM 54X84 (DRAPES) ×3 IMPLANT
DRAPE INCISE IOBAN 66X45 STRL (DRAPES) IMPLANT
DRAPE POUCH INSTRU U-SHP 10X18 (DRAPES) ×3 IMPLANT
DRAPE SPLIT 6X30 W/TAPE (DRAPES) ×6 IMPLANT
DRAPE U-SHAPE 47X51 STRL (DRAPES) ×3 IMPLANT
DRILL FLIPCUTTER II 9.0MM (INSTRUMENTS) IMPLANT
DRILL PIN RETRO (DRILL) ×3
DURAPREP 26ML APPLICATOR (WOUND CARE) ×9 IMPLANT
ELECT REM PT RETURN 9FT ADLT (ELECTROSURGICAL) ×3
ELECTRODE REM PT RTRN 9FT ADLT (ELECTROSURGICAL) ×1 IMPLANT
FASTFIX NDL DEL SYS 360 CVD (Miscellaneous) ×6 IMPLANT
FASTFIX NDL DEL SYS 360 STRT (Miscellaneous) ×12 IMPLANT
FLIPCUTTER II 9.0MM (INSTRUMENTS) ×3
GAUZE SPONGE 4X4 12PLY STRL (GAUZE/BANDAGES/DRESSINGS) ×3 IMPLANT
GAUZE XEROFORM 1X8 LF (GAUZE/BANDAGES/DRESSINGS) ×3 IMPLANT
GLOVE BIOGEL PI IND STRL 9 (GLOVE) ×1 IMPLANT
GLOVE BIOGEL PI INDICATOR 9 (GLOVE) ×2
GLOVE SURG 9.0 ORTHO LTXF (GLOVE) ×6 IMPLANT
GOWN STRL REUS TWL 2XL XL LVL4 (GOWN DISPOSABLE) ×3 IMPLANT
GOWN STRL REUS W/ TWL LRG LVL3 (GOWN DISPOSABLE) ×1 IMPLANT
GOWN STRL REUS W/TWL LRG LVL3 (GOWN DISPOSABLE) ×2
GRADUATE 1200CC STRL 31836 (MISCELLANEOUS) ×3 IMPLANT
GUIDEWIRE 1.2MMX18 (WIRE) ×5 IMPLANT
HANDLE YANKAUER SUCT BULB TIP (MISCELLANEOUS) ×3 IMPLANT
IV LACTATED RINGER IRRG 3000ML (IV SOLUTION) ×34
IV LR IRRIG 3000ML ARTHROMATIC (IV SOLUTION) ×6 IMPLANT
KIT BIO-TENODESIS 3X8 DISP (MISCELLANEOUS) ×2
KIT INSRT BABSR STRL DISP BTN (MISCELLANEOUS) IMPLANT
KIT TURNOVER KIT A (KITS) ×3 IMPLANT
LABEL OR SOLS (LABEL) ×3 IMPLANT
MANIFOLD NEPTUNE II (INSTRUMENTS) ×3 IMPLANT
MAT ABSORB  FLUID 56X50 GRAY (MISCELLANEOUS) ×4
MAT ABSORB FLUID 56X50 GRAY (MISCELLANEOUS) ×2 IMPLANT
NDL FILTER BLUNT 18X1 1/2 (NEEDLE) ×1 IMPLANT
NDL SAFETY ECLIPSE 18X1.5 (NEEDLE) ×1 IMPLANT
NEEDLE FILTER BLUNT 18X 1/2SAF (NEEDLE) ×2
NEEDLE FILTER BLUNT 18X1 1/2 (NEEDLE) ×1 IMPLANT
NEEDLE HYPO 18GX1.5 SHARP (NEEDLE) ×2
NEEDLE HYPO 22GX1.5 SAFETY (NEEDLE) ×3 IMPLANT
PACK ARTHROSCOPY KNEE (MISCELLANEOUS) ×3 IMPLANT
PAD ABD DERMACEA PRESS 5X9 (GAUZE/BANDAGES/DRESSINGS) ×6 IMPLANT
PAD CLEANER CAUTERY TIP 5X5 (MISCELLANEOUS) ×2
PAD WRAPON POLAR KNEE (MISCELLANEOUS) ×1 IMPLANT
PENCIL ELECTRO HAND CTR (MISCELLANEOUS) ×3 IMPLANT
PUSHER KNOT ARTHRO 360DEG (MISCELLANEOUS) ×2 IMPLANT
PUSHER KNOT ARTHRO STRT FASTFI (MISCELLANEOUS) ×2 IMPLANT
SCREW BIO FULL THREADED 10X28 (Screw) ×2 IMPLANT
SET TUBE SUCT SHAVER OUTFL 24K (TUBING) ×3 IMPLANT
SET TUBE TIP INTRA-ARTICULAR (MISCELLANEOUS) ×3 IMPLANT
SPONGE LAP 18X18 RF (DISPOSABLE) ×2 IMPLANT
STRIP CLOSURE SKIN 1/2X4 (GAUZE/BANDAGES/DRESSINGS) ×4 IMPLANT
SUCTION FRAZIER HANDLE 10FR (MISCELLANEOUS) ×2
SUCTION TUBE FRAZIER 10FR DISP (MISCELLANEOUS) ×1 IMPLANT
SUT 2 FIBERLOOP 20 STRT BLUE (SUTURE) ×9
SUT ETHILON 4-0 (SUTURE) ×4
SUT ETHILON 4-0 FS2 18XMFL BLK (SUTURE) ×2
SUT FIBERSNARE 2 CLSD LOOP (SUTURE) ×2 IMPLANT
SUT FIBERWIRE #2 38 BLUE 1/2 (SUTURE) ×3
SUT FIBERWIRE #2 38 T-5 BLUE (SUTURE) ×6
SUT MNCRL AB 4-0 PS2 18 (SUTURE) ×3 IMPLANT
SUT ORTHOCORD 2X36 W/O NDL (SUTURE) IMPLANT
SUT VIC AB 0 CT1 36 (SUTURE) ×3 IMPLANT
SUT VIC AB 2-0 CT2 27 (SUTURE) IMPLANT
SUT VIC AB 2-0 SH 27 (SUTURE) ×2
SUT VIC AB 2-0 SH 27XBRD (SUTURE) ×1 IMPLANT
SUTURE 2 FIBERLOOP 20 STRT BLU (SUTURE) ×2 IMPLANT
SUTURE ETHLN 4-0 FS2 18XMF BLK (SUTURE) ×1 IMPLANT
SUTURE FIBERWR #2 38 BLUE 1/2 (SUTURE) IMPLANT
SUTURE FIBERWR #2 38 T-5 BLUE (SUTURE) ×2 IMPLANT
SYR 10ML LL (SYRINGE) ×6 IMPLANT
SYR BULB IRRIG 60ML STRL (SYRINGE) ×3 IMPLANT
SYSTEM IMPL ACL/PCL SWIVILLOCK (Anchor) ×2 IMPLANT
SYSTEM NDL DEL FSTFX  360 CVD (Miscellaneous) IMPLANT
SYSTEM NDL DEL FSTFX  360 STRT (Miscellaneous) IMPLANT
TAPE UMBIL 1/8X18 RADIOPA (MISCELLANEOUS) ×3 IMPLANT
TUBING ARTHRO INFLOW-ONLY STRL (TUBING) ×3 IMPLANT
TUBING CONNECTING 10 (TUBING) IMPLANT
TUBING CONNECTING 10' (TUBING)
WAND HAND CNTRL MULTIVAC 90 (MISCELLANEOUS) ×3 IMPLANT
WRAPON POLAR PAD KNEE (MISCELLANEOUS) ×3

## 2019-03-02 NOTE — Anesthesia Postprocedure Evaluation (Signed)
Anesthesia Post Note  Patient: Perry Hansen  Procedure(s) Performed: LEFT KNEE ARTHROSCOPY,LEFT KNEE ANTERIOR CRUCIATE LIGAMENT RECONSTRUCTION WITH AUTOGRAFT AND MEDIAL MENISCAL REPAIR (Left Knee)  Patient location during evaluation: PACU Anesthesia Type: General Level of consciousness: awake and alert Pain management: pain level controlled Vital Signs Assessment: post-procedure vital signs reviewed and stable Respiratory status: spontaneous breathing, nonlabored ventilation, respiratory function stable and patient connected to nasal cannula oxygen Cardiovascular status: blood pressure returned to baseline and stable Postop Assessment: no apparent nausea or vomiting Anesthetic complications: no     Last Vitals:  Vitals:   03/02/19 1400 03/02/19 1427  BP: (!) 167/91 (!) 142/89  Pulse: 93 92  Resp: 18 17  Temp:  36.7 C  SpO2: 97% 95%    Last Pain:  Vitals:   03/02/19 1427  TempSrc:   PainSc: 5                  Martha Clan

## 2019-03-02 NOTE — Transfer of Care (Signed)
Immediate Anesthesia Transfer of Care Note  Patient: Perry Hansen  Procedure(s) Performed: LEFT KNEE ARTHROSCOPY,LEFT KNEE ANTERIOR CRUCIATE LIGAMENT RECONSTRUCTION WITH AUTOGRAFT AND MEDIAL MENISCAL REPAIR (Left Knee)  Patient Location: PACU  Anesthesia Type:General  Level of Consciousness: drowsy  Airway & Oxygen Therapy: Patient Spontanous Breathing and Patient connected to face mask oxygen  Post-op Assessment: Report given to RN, Post -op Vital signs reviewed and stable and Patient moving all extremities  Post vital signs: Reviewed and stable  Last Vitals:  Vitals Value Taken Time  BP 157/84 03/02/19 1240  Temp 36.8 C 03/02/19 1240  Pulse 123 03/02/19 1245  Resp 16 03/02/19 1245  SpO2 93 % 03/02/19 1245  Vitals shown include unvalidated device data.  Last Pain:  Vitals:   03/02/19 1240  TempSrc:   PainSc: 0-No pain      Patients Stated Pain Goal: 1 (00/92/33 0076)  Complications: No apparent anesthesia complications

## 2019-03-02 NOTE — Anesthesia Procedure Notes (Signed)
Procedure Name: Intubation Date/Time: 03/02/2019 8:19 AM Performed by: Esaw Grandchild, CRNA Pre-anesthesia Checklist: Patient identified, Emergency Drugs available, Suction available and Patient being monitored Patient Re-evaluated:Patient Re-evaluated prior to induction Oxygen Delivery Method: Circle system utilized Preoxygenation: Pre-oxygenation with 100% oxygen Induction Type: IV induction Ventilation: Mask ventilation without difficulty Laryngoscope Size: Miller and 2 Grade View: Grade II Tube type: Oral Tube size: 8.0 mm Number of attempts: 1 Airway Equipment and Method: Stylet and Oral airway Placement Confirmation: ETT inserted through vocal cords under direct vision,  positive ETCO2 and breath sounds checked- equal and bilateral Secured at: 25 cm Tube secured with: Tape Dental Injury: Teeth and Oropharynx as per pre-operative assessment

## 2019-03-02 NOTE — Discharge Instructions (Signed)
AMBULATORY SURGERY  °DISCHARGE INSTRUCTIONS ° ° °1) The drugs that you were given will stay in your system until tomorrow so for the next 24 hours you should not: ° °A) Drive an automobile °B) Make any legal decisions °C) Drink any alcoholic beverage ° ° °2) You may resume regular meals tomorrow.  Today it is better to start with liquids and gradually work up to solid foods. ° °You may eat anything you prefer, but it is better to start with liquids, then soup and crackers, and gradually work up to solid foods. ° ° °3) Please notify your doctor immediately if you have any unusual bleeding, trouble breathing, redness and pain at the surgery site, drainage, fever, or pain not relieved by medication. ° ° ° °4) Additional Instructions: ° ° ° ° ° ° ° °Please contact your physician with any problems or Same Day Surgery at [phone], Monday through Friday 6 am to 4 pm, or [facility] [place] Main number at [phone]. °

## 2019-03-02 NOTE — H&P (Signed)
PREOPERATIVE H&P  Chief Complaint: S83.512D sprain of anterior cruciate ligament of left knee  HPI: Perry Hansen is a 48 y.o. male who presents for preoperative history and physical with a diagnosis of S83.512D sprain of anterior cruciate ligament of left knee. Symptoms of pain, swelling and instability are significantly impairing activities of daily living.  Patient sustained injury at work while jumping off a trailer.  He has agreed with surgical management.  Patient has failed conservative management including with physical therapy.  He states he has some intermittent paresthesias at baseline.  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  Past Medical History:  Diagnosis Date  . Chronic urticaria   . Edema   . GERD (gastroesophageal reflux disease)   . Hypertension    Past Surgical History:  Procedure Laterality Date  . Keeler   surgery on stomach as a kid because he swallowed a marble  . SHOULDER ARTHROSCOPY Right 2010   Social History   Socioeconomic History  . Marital status: Married    Spouse name: Judeen Hammans  . Number of children: Not on file  . Years of education: Not on file  . Highest education level: Not on file  Occupational History  . Occupation: foreman  Social Needs  . Financial resource strain: Not on file  . Food insecurity    Worry: Not on file    Inability: Not on file  . Transportation needs    Medical: Not on file    Non-medical: Not on file  Tobacco Use  . Smoking status: Current Every Day Smoker    Packs/day: 1.00    Years: 32.00    Pack years: 32.00    Types: Cigarettes  . Smokeless tobacco: Never Used  Substance and Sexual Activity  . Alcohol use: Yes    Comment: occ  . Drug use: Never  . Sexual activity: Not on file  Lifestyle  . Physical activity    Days per week: Not on file    Minutes per session: Not on file  . Stress: Not on file  Relationships  .  Social Herbalist on phone: Not on file    Gets together: Not on file    Attends religious service: Not on file    Active member of club or organization: Not on file    Attends meetings of clubs or organizations: Not on file    Relationship status: Not on file  Other Topics Concern  . Not on file  Social History Narrative  . Not on file   History reviewed. No pertinent family history. Allergies  Allergen Reactions  . Meloxicam Nausea And Vomiting  . Naproxen Hives   Prior to Admission medications   Medication Sig Start Date End Date Taking? Authorizing Provider  diphenhydrAMINE (BENADRYL) 25 MG tablet Take 25 mg by mouth every 6 (six) hours as needed.   Yes [provider]  hydrochlorothiazide (HYDRODIURIL) 25 MG tablet Take 25 mg by mouth daily.   Yes [provider]  potassium chloride (K-DUR) 10 MEQ tablet Take 10 mEq by mouth daily.   Yes [provider]  predniSONE (DELTASONE) 10 MG tablet Take 10 mg by mouth daily as needed (chronic urticaria).    Yes [provider]  traMADol (ULTRAM) 50 MG tablet Take 50 mg by mouth every 6 (six) hours as needed for moderate pain.   Yes [provider]  HYDROcodone-acetaminophen [facility])  5-325 MG tablet Take 1 tablet by mouth every 4 (four) hours as needed for moderate pain. Patient not taking: Reported on 02/17/2019 10/17/18   Willy Eddyobinson, Patrick, MD  oxyCODONE-acetaminophen (PERCOCET) 5-325 MG tablet Take 1 tablet by mouth every 4 (four) hours as needed. Patient not taking: Reported on 02/17/2019 01/11/19 01/11/20  Darci CurrentBrown, Unity N, MD     Positive ROS: All other systems have been reviewed and were otherwise negative with the exception of those mentioned in the HPI and as above.  Physical Exam: General: Alert, no acute distress Cardiovascular: Regular rate and rhythm, no murmurs rubs or gallops.  No pedal edema Respiratory: Clear to auscultation bilaterally, no wheezes rales or rhonchi. No  cyanosis, no use of accessory musculature GI: No organomegaly, abdomen is soft and non-tender nondistended with positive bowel sounds. Skin: Skin intact, no lesions within the operative field. Neurologic: Sensation intact distally Psychiatric: Patient is competent for consent with normal mood and affect Lymphatic: No cervical lymphadenopathy  MUSCULOSKELETAL: Right lower extremity: Patient has intact skin.  He has no erythema ecchymosis or significant effusion today.  Patient has palpable pedal pulses, intact 6 light touch and intact motor function.  Assessment: S83.512D sprain of anterior cruciate ligament of left knee  Plan: Plan for Procedure(s): LEFT KNEE ARTHROSCOPIC ASSISTED RECONSTRUCTION ANTERIOR CRUCIATE LIGAMENT (ACL)  I reviewed the details of the operation as well as the postoperative course with the patient.  Patient has a hinged knee brace given to him from [employer].  A preop H&P was performed at the bedside.  I discussed the risks and benefits of surgery. The risks include but are not limited to infection, bleeding, nerve or blood vessel injury, joint stiffness or loss of motion, persistent pain, weakness or instability, re-tear of the ACL, failure of the repair, the need to use an allograft ACL hardware failure and the need for further surgery. Medical risks include but are not limited to DVT and pulmonary embolism, myocardial infarction, stroke, pneumonia, respiratory failure and death. Patient understood these risks and wished to proceed.    Juanell FairlyKevin Neala Miggins, MD   03/02/2019 8:01 AM

## 2019-03-02 NOTE — Progress Notes (Signed)
Pt in PACU reporting pain 6 out of 10. Describes as pressure/throbbing. Fentanyl 25mg  x 4 given. Pt states he does not want any more fetanyl but will take oxycodone 5mg  as ordered PRN. Oxycodone given for pain.

## 2019-03-02 NOTE — Op Note (Signed)
03/02/2019  1:10 PM  PATIENT:  Perry Hansen    PRE-OPERATIVE DIAGNOSIS:  S83.512D sprain of anterior cruciate ligament of left knee  POST-OPERATIVE DIAGNOSIS:  Same  PROCEDURE:  LEFT KNEE ARTHROSCOPIC ASSISTED ANTERIOR CRUCIATE LIGAMENT RECONSTRUCTION WITH HAMSTRING AUTOGRAFT AND MEDIAL MENISCAL REPAIR  SURGEON:  Juanell Fairly, MD  ASSISTANT:  Jennet Maduro, PA  ANESTHESIA:   General  PREOPERATIVE INDICATIONS:  Perry Hansen is a  48 y.o. male with a diagnosis of S83.512D sprain of anterior cruciate ligament of left knee who failed conservative treatment and elected for surgical management.  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  An MRI of the left knee demonstrates an ACL tear and medial meniscus tear.    The risks benefits and alternatives were discussed with the patient preoperatively including but not limited to the risks of infection, bleeding, nerve or blood vessel injury, knee stiffness/arthrofibrosis, hardware failure, re-tear of the anterior cruciate ligament graft, persistent pain or instability, osteoarthritis and the need for revision surgery.  Medical risks include but are not limited to DVT and pulmonary embolism, stroke, pneumonia, respiratory failure and death. Patient understood these risks and wished to proceed with surgical reconstruction.   OPERATIVE IMPLANTS: Arthrex anterior cruciate ligament tightrope RC, Artherex biocomposite 10 x 28 mm tibial interference screw and Arthrex swivel lock anchors x 2.    OPERATIVE FINDINGS: Displaced bucket-handle tear to the medial meniscus.  Chondrosis of the medial compartment with partial-thickness cartilage flap of the medial femoral condyle.  There is fissuring of the medial femoral condyle as well.  Patient had a torn ACL from the femoral attachment.  OPERATIVE PROCEDURE: Patient was in the preoperative area. The left knee was marked with the word yes according the hospital's correct site of surgery  protocol. The patient was brought to the operating room and placed in the supine position. General anesthesia was administered. The were given for antibiotic prophylaxis. The lower extremity was prepped and draped in usual sterile fashion.   A time out was performed to verify the patient's name, date of birth, medical record number, correct site of surgery correct procedure to be performed. It was also used to verify the patient received antibiotics and all appropriate instruments, implants and radiographs studies were available in the room. Once all in attendance were in agreement case began. A tourniquet was applied to the left upper thigh but was not inflated.  Exam under anesthesia was performed which demonstrated range of motion from full extension to 115 degrees of flexion.   Patient had no instability to varus valgus stress testing at 0 and 30 of flexion.  He did not have a significant effusion.  He had approximately 5 to 10 mm of anterior laxity on Lachman's and anterior drawer testing.  He had equivocal pivot shift.  Proposed arthroscopy incisions were drawn out with a surgical marker and pre-injected with 1% lidocaine plain. An 11 blade was used to establish an inferior medial and lateral portals. The medial portal was created under direct visualization using an 18-gauge spinal needle for localization. A full diagnostic examination of the knee was performed including the suprapatellar pouch, the patella femoral joint, medial lateral gutters, the medial and lateral compartments, the intercondylar notch in the posterior knee.   The medial meniscus was found to have a bucket-handle tear which was displaced.  This was a peripheral tear.  Given the size of the tear the decision was made to attempt a meniscal repair.  [employer] FasT-Fix 360 anchors were used to secure the bucket handle tear to the capsule.  A combination of straight and curved meniscal repair anchors were used.  After meniscal  repair the meniscus was probed and found to be stable.  He could no longer be displaced into the joint.  The attention was then turned back to the ACL.  The anterior cruciate ligament fibers were debrided with a 4.0 mm resector shaver blade. A 5.5 resector shaver blade was used perform a notchplasty. Once the intercondylar notch was prepped the attention was turned to harvesting the hamstring autografts.  A longitudinal incision was made over the anteromedial proximal tibia. The sartorius fascia was incised with a 15 blade and reflected to reveal the underlying gracilis and semitendinosus. These were harvested using a tendon stripper. The grafts were prepared on the back table. The graft was measured to be 9 mm on the femoral side and 9.5 mm on the tibial side. The length of the graft was 250 mm. The graft was placed on the Graftmaster table under 15 mmHg of tension and kept moist on the back table until implantation.   The attention was then turned to tunnel creation. The femoral tunnel cutting guide was then placed through the lateral portal. The arthroscope was placed in the medial portal at this point. The intercondylar distance was measured at 35 mm at. A flip cutter drill guide was advanced into the intercondylar notch. The blade was engaged and the femoral tunnel was created in a retrograde fashion to 30 mm. A fiber stick suture was placed through the femoral tunnel brought out the lateral portal and clamped for later graft passage.   The attention was then turned to tibial tunnel creation. This was done with a fixed angle tibial retro-drill guide. A drill pin was inserted through the anterior tibia and advanced until it engaged the 9.5 mm drill bit. A tibial tunnel was then created in a retrograde fashion. The fiber stick suture was brought out through the tibial tunnel. The 4 stranded hamstring tibial autograft was then shuttled through the knee using the fiber stick graft. Once the button was  flipped on the lateral femoral cortex FluoroScan image was taken to confirm it was laying flat against the lateral cortex of the femur. Once this was confirmed the hamstring graft was advanced into position using the white suture ends of the Arthrex tight rope RC button. The graft was bottomed out into the femoral tunnel. The knee was then cycled 25 times to remove creep. The knee was then flexed approximately 30. An Arthrex bio composite interference screw 10 x 28 mm was then advanced into position with countertraction on the tibial side of the graft and a posterior drawer force directed to the tibia. Once the interference screw was in position, 2 Arthrex swivel lock anchors were used to provide backup tibial fixation.  The sutures from the grafts were separated into groups of 4 suture strands which were fed into the 2 swivel lock anchors and screwed into the proximal medial tibia..  The patient had a firm endpoint without anterior laxity on Lachman's test. The range of motion was 0-115. Final arthroscopic images of the graft were taken. There was no graft impingement in full extension. The wounds were copiously irrigated. The deep fascia of the anterior tibial incision was closed with interrupted 0 Vicryl.  The of the tibial incision subcutaneous tissue was closed with a 2-0 Vicryl and the skin was approximated with a running  4-0 Monocryl. The arthroscopy portal incisions were closed with 4-0 nylon along with the small stab incision over the lateral femur used for placement of the femoral tunnel.  Patient had a dry sterile dressing applied along with Steri-Strips and Xeroform. The incisions and the joint were injected with 0.25% Marcaine plain.  Patient had a Polar Care sleeve placed along with a hinged knee brace locked in extension. The patient was brought to the PACU in stable condition. I was scrubbed and present the entire case and all sharp and instrument counts were correct at conclusion the case.    The patient was awakened and brought to PACU in stable condition. I spoke with the patient's wife in the postop consultation room to let her know that patient was stable in recovery room the case had been performed without complication.

## 2019-03-02 NOTE — Anesthesia Post-op Follow-up Note (Signed)
Anesthesia QCDR form completed.        

## 2019-03-02 NOTE — Progress Notes (Signed)
Pt. Advised Megan from his Dr. Gabriel Carina will call with follow-up visit.

## 2019-03-02 NOTE — Anesthesia Preprocedure Evaluation (Signed)
Anesthesia Evaluation  Patient identified by MRN, date of birth, ID band Patient awake    Reviewed: Allergy & Precautions, NPO status , Patient's Chart, lab work & pertinent test results  History of Anesthesia Complications Negative for: history of anesthetic complications  Airway Mallampati: II  TM Distance: >3 FB Neck ROM: Full    Dental  (+) Poor Dentition   Pulmonary neg sleep apnea, neg COPD, Current Smoker and Patient abstained from smoking.,    breath sounds clear to auscultation- rhonchi (-) wheezing      Cardiovascular hypertension, Pt. on medications (-) CAD, (-) Past MI, (-) Cardiac Stents and (-) CABG  Rhythm:Regular Rate:Normal - Systolic murmurs and - Diastolic murmurs    Neuro/Psych neg Seizures negative neurological ROS  negative psych ROS   GI/Hepatic Neg liver ROS, GERD  ,  Endo/Other  negative endocrine ROSneg diabetes  Renal/GU negative Renal ROS     Musculoskeletal negative musculoskeletal ROS (+)   Abdominal (+) + obese,   Peds  Hematology negative hematology ROS (+)   Anesthesia Other Findings Past Medical History: No date: Chronic urticaria No date: Edema No date: GERD (gastroesophageal reflux disease) No date: Hypertension   Reproductive/Obstetrics                             Anesthesia Physical Anesthesia Plan  ASA: II  Anesthesia Plan: General   Post-op Pain Management:    Induction: Intravenous  PONV Risk Score and Plan: 0 and Ondansetron  Airway Management Planned: Oral ETT  Additional Equipment:   Intra-op Plan:   Post-operative Plan: Extubation in OR  Informed Consent: I have reviewed the patients History and Physical, chart, labs and discussed the procedure including the risks, benefits and alternatives for the proposed anesthesia with the patient or authorized representative who has indicated his/her understanding and acceptance.      Dental advisory given  Plan Discussed with: CRNA and Anesthesiologist  Anesthesia Plan Comments:         Anesthesia Quick Evaluation

## 2019-03-03 ENCOUNTER — Encounter: Payer: Self-pay | Admitting: Orthopedic Surgery

## 2019-04-01 ENCOUNTER — Encounter: Payer: Self-pay | Admitting: Orthopedic Surgery

## 2019-11-01 DIAGNOSIS — E119 Type 2 diabetes mellitus without complications: Secondary | ICD-10-CM | POA: Insufficient documentation

## 2019-11-01 DIAGNOSIS — E669 Obesity, unspecified: Secondary | ICD-10-CM | POA: Insufficient documentation

## 2019-11-01 HISTORY — DX: Obesity, unspecified: E66.9

## 2019-12-21 DIAGNOSIS — E781 Pure hyperglyceridemia: Secondary | ICD-10-CM | POA: Insufficient documentation

## 2020-03-24 ENCOUNTER — Other Ambulatory Visit: Payer: Self-pay

## 2020-03-24 ENCOUNTER — Other Ambulatory Visit
Admission: RE | Admit: 2020-03-24 | Discharge: 2020-03-24 | Disposition: A | Discharge status: Home / Self Care | Payer: HRSA Program | Source: Ambulatory Visit | Attending: Orthopedic Surgery | Admitting: Orthopedic Surgery

## 2020-03-24 DIAGNOSIS — Z20822 Contact with and (suspected) exposure to covid-19: Secondary | ICD-10-CM | POA: Diagnosis not present

## 2020-03-24 DIAGNOSIS — Z01812 Encounter for preprocedural laboratory examination: Secondary | ICD-10-CM | POA: Diagnosis present

## 2020-03-24 LAB — SARS CORONAVIRUS 2 (TAT 6-24 HRS): SARS Coronavirus 2: NEGATIVE

## 2020-03-27 ENCOUNTER — Encounter
Admission: RE | Admit: 2020-03-27 | Discharge: 2020-03-27 | Disposition: A | Discharge status: Home / Self Care | Payer: Self-pay | Source: Ambulatory Visit | Attending: Orthopedic Surgery | Admitting: Orthopedic Surgery

## 2020-03-27 ENCOUNTER — Other Ambulatory Visit: Payer: Self-pay

## 2020-03-27 ENCOUNTER — Other Ambulatory Visit: Payer: Self-pay | Admitting: Orthopedic Surgery

## 2020-03-27 HISTORY — DX: Personal history of urinary calculi: Z87.442

## 2020-03-27 NOTE — Patient Instructions (Addendum)
Your procedure is scheduled on: Tuesday 03/28/20.  Report to DAY SURGERY DEPARTMENT LOCATED ON 2ND FLOOR MEDICAL MALL ENTRANCE. To find out your arrival time please call [phone] between 1PM - 3PM on Monday 03/27/20.   Remember: Instructions that are not followed completely may result in serious medical risk, up to and including death, or upon the discretion of your surgeon and anesthesiologist your surgery may need to be rescheduled.     __X__ 1. Do not eat food after midnight the night before your procedure.                 No gum chewing or hard candies. You may drink SUGAR FREE clear liquids up to 2 hours                 before you are scheduled to arrive for your surgery- DO NOT drink clear                 liquids within 2 hours of the start of your surgery.                   __X__2.  On the morning of surgery brush your teeth with toothpaste and water, you may rinse your mouth with mouthwash if you wish.  Do not swallow any toothpaste or mouthwash.    __X__ 3.  No Alcohol for 24 hours before or after surgery.  __X__ 4.  Do Not Smoke or use e-cigarettes For 24 Hours Prior to Your Surgery.                 Do not use any chewable tobacco products for at least 6 hours prior to                 surgery.  __X__5.  Notify your doctor if there is any change in your medical condition      (cold, fever, infections).      Do NOT wear jewelry, make-up, hairpins, clips or nail polish. Do NOT wear lotions, powders, or perfumes.  Do NOT shave 48 hours prior to surgery. Men may shave face and neck. Do NOT bring valuables to the hospital.     [facility] is not responsible for any belongings or valuables.   Contacts, dentures/partials or body piercings may not be worn into surgery. Bring a case for your contacts, glasses or hearing aids, a denture cup will be supplied.   Patients discharged the day of surgery will not be allowed to drive home.     __X__ Take these medicines the  morning of surgery with A SIP OF WATER:     1. pantoprazole (PROTONIX)  2. predniSONE (DELTASONE) IF NEEDED  3. loratadine (CLARITIN)  IF NEEDED     __X__ Shower there night before and the morning of your surgery.  __X__ Stop Metformin 2 days prior to surgery.     __X__ Stop Anti-inflammatories 7 days before surgery such as Advil, Ibuprofen, Motrin, BC or Goodies Powder, Naprosyn, Naproxen, Aleve, Aspirin, Meloxicam. May take Tylenol if needed for pain or discomfort.   __X__Do not start taking any new herbal supplements or vitamins prior to your procedure.    Wear comfortable clothing (specific to your surgery type) to the hospital.  Plan for stool softeners for home use; pain medications have a tendency to cause constipation. You can also help prevent constipation by eating foods high in fiber such as fruits and vegetables and drinking plenty of fluids as your diet allows.  After surgery, you can prevent lung complications by doing breathing exercises.Take deep breaths and cough every 1-2 hours. Your doctor may order a device called an Incentive Spirometer to help you take deep breaths.  Please call the Pre-Admissions Testing Department at [phone] if you have any questions about these instructions.

## 2020-03-28 ENCOUNTER — Ambulatory Visit: Payer: Worker's Compensation | Admitting: Registered Nurse

## 2020-03-28 ENCOUNTER — Encounter
Admission: RE | Disposition: A | Discharge status: Home / Self Care | Payer: Self-pay | Source: Home / Self Care | Attending: Orthopedic Surgery

## 2020-03-28 ENCOUNTER — Encounter: Payer: Self-pay | Admitting: Orthopedic Surgery

## 2020-03-28 ENCOUNTER — Ambulatory Visit
Admission: RE | Admit: 2020-03-28 | Discharge: 2020-03-28 | Disposition: A | Discharge status: Home / Self Care | Payer: Worker's Compensation | Attending: Orthopedic Surgery | Admitting: Orthopedic Surgery

## 2020-03-28 DIAGNOSIS — Z79899 Other long term (current) drug therapy: Secondary | ICD-10-CM | POA: Diagnosis not present

## 2020-03-28 DIAGNOSIS — E119 Type 2 diabetes mellitus without complications: Secondary | ICD-10-CM | POA: Diagnosis not present

## 2020-03-28 DIAGNOSIS — Z87442 Personal history of urinary calculi: Secondary | ICD-10-CM | POA: Diagnosis not present

## 2020-03-28 DIAGNOSIS — Z7952 Long term (current) use of systemic steroids: Secondary | ICD-10-CM | POA: Insufficient documentation

## 2020-03-28 DIAGNOSIS — Z791 Long term (current) use of non-steroidal anti-inflammatories (NSAID): Secondary | ICD-10-CM | POA: Insufficient documentation

## 2020-03-28 DIAGNOSIS — S83242A Other tear of medial meniscus, current injury, left knee, initial encounter: Secondary | ICD-10-CM | POA: Diagnosis present

## 2020-03-28 DIAGNOSIS — X58XXXA Exposure to other specified factors, initial encounter: Secondary | ICD-10-CM | POA: Insufficient documentation

## 2020-03-28 DIAGNOSIS — F17200 Nicotine dependence, unspecified, uncomplicated: Secondary | ICD-10-CM | POA: Diagnosis not present

## 2020-03-28 DIAGNOSIS — M2342 Loose body in knee, left knee: Secondary | ICD-10-CM | POA: Insufficient documentation

## 2020-03-28 DIAGNOSIS — F17210 Nicotine dependence, cigarettes, uncomplicated: Secondary | ICD-10-CM | POA: Insufficient documentation

## 2020-03-28 DIAGNOSIS — Z888 Allergy status to other drugs, medicaments and biological substances status: Secondary | ICD-10-CM | POA: Diagnosis not present

## 2020-03-28 DIAGNOSIS — Z886 Allergy status to analgesic agent status: Secondary | ICD-10-CM | POA: Insufficient documentation

## 2020-03-28 DIAGNOSIS — I10 Essential (primary) hypertension: Secondary | ICD-10-CM | POA: Diagnosis not present

## 2020-03-28 DIAGNOSIS — Z7984 Long term (current) use of oral hypoglycemic drugs: Secondary | ICD-10-CM | POA: Insufficient documentation

## 2020-03-28 DIAGNOSIS — K219 Gastro-esophageal reflux disease without esophagitis: Secondary | ICD-10-CM | POA: Insufficient documentation

## 2020-03-28 DIAGNOSIS — S83232A Complex tear of medial meniscus, current injury, left knee, initial encounter: Secondary | ICD-10-CM | POA: Insufficient documentation

## 2020-03-28 HISTORY — DX: Type 2 diabetes mellitus without complications: E11.9

## 2020-03-28 HISTORY — PX: KNEE ARTHROSCOPY WITH MEDIAL MENISECTOMY: SHX5651

## 2020-03-28 LAB — CBC
HCT: 45.1 % (ref 39.0–52.0)
Hemoglobin: 15.3 g/dL (ref 13.0–17.0)
MCH: 29.5 pg (ref 26.0–34.0)
MCHC: 33.9 g/dL (ref 30.0–36.0)
MCV: 86.9 fL (ref 80.0–100.0)
Platelets: 289 10*3/uL (ref 150–400)
RBC: 5.19 MIL/uL (ref 4.22–5.81)
RDW: 14.5 % (ref 11.5–15.5)
WBC: 13.4 10*3/uL — ABNORMAL HIGH (ref 4.0–10.5)
nRBC: 0 % (ref 0.0–0.2)

## 2020-03-28 LAB — BASIC METABOLIC PANEL
Anion gap: 10 (ref 5–15)
BUN: 19 mg/dL (ref 6–20)
CO2: 27 mmol/L (ref 22–32)
Calcium: 8.7 mg/dL — ABNORMAL LOW (ref 8.9–10.3)
Chloride: 101 mmol/L (ref 98–111)
Creatinine, Ser: 1.04 mg/dL (ref 0.61–1.24)
GFR, Estimated: >60 mL/min (ref >60)
Glucose, Bld: 100 mg/dL — ABNORMAL HIGH (ref 70–99)
Potassium: 3.6 mmol/L (ref 3.5–5.1)
Sodium: 138 mmol/L (ref 135–145)

## 2020-03-28 LAB — PROTIME-INR
INR: 0.9 (ref 0.8–1.2)
Prothrombin Time: 12.2 seconds (ref 11.4–15.2)

## 2020-03-28 LAB — GLUCOSE, CAPILLARY
Glucose-Capillary: 97 mg/dL (ref 70–99)
Glucose-Capillary: 130 mg/dL — ABNORMAL HIGH (ref 70–99)

## 2020-03-28 LAB — APTT: aPTT: 28 seconds (ref 24–36)

## 2020-03-28 SURGERY — ARTHROSCOPY, KNEE, WITH MEDIAL MENISCECTOMY
Anesthesia: General | Site: Knee | Laterality: Left

## 2020-03-28 MED ORDER — LIDOCAINE HCL (PF) 1 % IJ SOLN
INTRAMUSCULAR | Status: DC | PRN
  Administered 2020-03-28: 5 mL

## 2020-03-28 MED ORDER — DEXAMETHASONE SODIUM PHOSPHATE 10 MG/ML IJ SOLN
INTRAMUSCULAR | Status: AC
  Filled 2020-03-28: qty 1

## 2020-03-28 MED ORDER — DEXMEDETOMIDINE HCL 200 MCG/2ML IV SOLN
INTRAVENOUS | Status: DC | PRN
  Administered 2020-03-28 (×2): 8 ug via INTRAVENOUS
  Administered 2020-03-28: 12 ug via INTRAVENOUS

## 2020-03-28 MED ORDER — ORAL CARE MOUTH RINSE: 15.0000 mL | Freq: Once | OROMUCOSAL | Status: AC

## 2020-03-28 MED ORDER — FENTANYL CITRATE (PF) 100 MCG/2ML IJ SOLN
INTRAMUSCULAR | Status: AC
  Administered 2020-03-28: 25 ug via INTRAVENOUS
  Filled 2020-03-28: qty 2

## 2020-03-28 MED ORDER — LIDOCAINE HCL (CARDIAC) PF 100 MG/5ML IV SOSY
PREFILLED_SYRINGE | INTRAVENOUS | Status: DC | PRN
  Administered 2020-03-28: 100 mg via INTRAVENOUS

## 2020-03-28 MED ORDER — DEXMEDETOMIDINE (PRECEDEX) IN NS 20 MCG/5ML (4 MCG/ML) IV SYRINGE
PREFILLED_SYRINGE | INTRAVENOUS | Status: AC
  Filled 2020-03-28: qty 5

## 2020-03-28 MED ORDER — FENTANYL CITRATE (PF) 100 MCG/2ML IJ SOLN
INTRAMUSCULAR | Status: AC
  Filled 2020-03-28: qty 2

## 2020-03-28 MED ORDER — ONDANSETRON HCL 4 MG PO TABS: 4.0000 mg | ORAL_TABLET | Freq: Three times a day (TID) | ORAL | 0 refills | Status: DC | PRN

## 2020-03-28 MED ORDER — CHLORHEXIDINE GLUCONATE CLOTH 2 % EX PADS
6.0000 | MEDICATED_PAD | Freq: Once | CUTANEOUS | Status: AC
  Administered 2020-03-28: 6 via TOPICAL

## 2020-03-28 MED ORDER — MIDAZOLAM HCL 2 MG/2ML IJ SOLN
INTRAMUSCULAR | Status: DC | PRN
  Administered 2020-03-28: 2 mg via INTRAVENOUS

## 2020-03-28 MED ORDER — ASPIRIN EC 325 MG PO TBEC: 325.0000 mg | DELAYED_RELEASE_TABLET | Freq: Every day | ORAL | 0 refills | Status: DC

## 2020-03-28 MED ORDER — CHLORHEXIDINE GLUCONATE 0.12 % MT SOLN
OROMUCOSAL | Status: AC
  Administered 2020-03-28: 15 mL via OROMUCOSAL
  Filled 2020-03-28: qty 15

## 2020-03-28 MED ORDER — ONDANSETRON HCL 4 MG/2ML IJ SOLN
INTRAMUSCULAR | Status: DC | PRN
  Administered 2020-03-28: 4 mg via INTRAVENOUS

## 2020-03-28 MED ORDER — PROPOFOL 10 MG/ML IV BOLUS
INTRAVENOUS | Status: DC | PRN
  Administered 2020-03-28: 180 mg via INTRAVENOUS
  Administered 2020-03-28: 20 mg via INTRAVENOUS

## 2020-03-28 MED ORDER — LACTATED RINGERS IV SOLN: INTRAVENOUS | Status: DC | PRN

## 2020-03-28 MED ORDER — FENTANYL CITRATE (PF) 100 MCG/2ML IJ SOLN
INTRAMUSCULAR | Status: DC | PRN
  Administered 2020-03-28 (×4): 25 ug via INTRAVENOUS

## 2020-03-28 MED ORDER — SODIUM CHLORIDE (PF) 0.9 % IJ SOLN
INTRAMUSCULAR | Status: AC
  Filled 2020-03-28: qty 10

## 2020-03-28 MED ORDER — ALBUTEROL SULFATE HFA 108 (90 BASE) MCG/ACT IN AERS
INHALATION_SPRAY | RESPIRATORY_TRACT | Status: AC
  Filled 2020-03-28: qty 6.7

## 2020-03-28 MED ORDER — KETAMINE HCL 50 MG/ML IJ SOLN
INTRAMUSCULAR | Status: AC
  Filled 2020-03-28: qty 10

## 2020-03-28 MED ORDER — HYDROCODONE-ACETAMINOPHEN 5-325 MG PO TABS
1.0000 | ORAL_TABLET | ORAL | 0 refills | Status: DC | PRN
Start: 2020-03-28 — End: 2023-08-21

## 2020-03-28 MED ORDER — SODIUM CHLORIDE 0.9 % IV SOLN: INTRAVENOUS | Status: DC

## 2020-03-28 MED ORDER — KETAMINE HCL 10 MG/ML IJ SOLN
INTRAMUSCULAR | Status: DC | PRN
  Administered 2020-03-28 (×3): 10 mg via INTRAVENOUS

## 2020-03-28 MED ORDER — FENTANYL CITRATE (PF) 100 MCG/2ML IJ SOLN
25.0000 ug | INTRAMUSCULAR | Status: DC | PRN
  Administered 2020-03-28: 25 ug via INTRAVENOUS

## 2020-03-28 MED ORDER — ROCURONIUM BROMIDE 10 MG/ML (PF) SYRINGE
PREFILLED_SYRINGE | INTRAVENOUS | Status: AC
  Filled 2020-03-28: qty 10

## 2020-03-28 MED ORDER — ALBUTEROL SULFATE HFA 108 (90 BASE) MCG/ACT IN AERS
INHALATION_SPRAY | RESPIRATORY_TRACT | Status: DC | PRN
  Administered 2020-03-28: 4 via RESPIRATORY_TRACT

## 2020-03-28 MED ORDER — PHENYLEPHRINE HCL (PRESSORS) 10 MG/ML IV SOLN
INTRAVENOUS | Status: DC | PRN
  Administered 2020-03-28: 100 ug via INTRAVENOUS
  Administered 2020-03-28 (×2): 200 ug via INTRAVENOUS
  Administered 2020-03-28: 100 ug via INTRAVENOUS
  Administered 2020-03-28: 200 ug via INTRAVENOUS
  Administered 2020-03-28: 100 ug via INTRAVENOUS

## 2020-03-28 MED ORDER — CEFAZOLIN SODIUM-DEXTROSE 2-4 GM/100ML-% IV SOLN
INTRAVENOUS | Status: AC
  Filled 2020-03-28: qty 100

## 2020-03-28 MED ORDER — CEFAZOLIN SODIUM-DEXTROSE 2-4 GM/100ML-% IV SOLN
2.0000 g | INTRAVENOUS | Status: AC
  Administered 2020-03-28: 2 g via INTRAVENOUS

## 2020-03-28 MED ORDER — ACETAMINOPHEN 500 MG PO TABS: 1000.0000 mg | ORAL_TABLET | ORAL | Status: AC

## 2020-03-28 MED ORDER — CHLORHEXIDINE GLUCONATE 0.12 % MT SOLN: 15.0000 mL | Freq: Once | OROMUCOSAL | Status: AC

## 2020-03-28 MED ORDER — MIDAZOLAM HCL 2 MG/2ML IJ SOLN
INTRAMUSCULAR | Status: AC
  Filled 2020-03-28: qty 2

## 2020-03-28 MED ORDER — ONDANSETRON HCL 4 MG/2ML IJ SOLN: 4.0000 mg | Freq: Once | INTRAMUSCULAR | Status: DC | PRN

## 2020-03-28 MED ORDER — DEXAMETHASONE SODIUM PHOSPHATE 10 MG/ML IJ SOLN
INTRAMUSCULAR | Status: DC | PRN
  Administered 2020-03-28: 4 mg via INTRAVENOUS

## 2020-03-28 MED ORDER — EPHEDRINE SULFATE 50 MG/ML IJ SOLN
INTRAMUSCULAR | Status: DC | PRN
  Administered 2020-03-28 (×3): 5 mg via INTRAVENOUS

## 2020-03-28 MED ORDER — ACETAMINOPHEN 500 MG PO TABS
ORAL_TABLET | ORAL | Status: AC
  Administered 2020-03-28: 1000 mg via ORAL
  Filled 2020-03-28: qty 2

## 2020-03-28 MED ORDER — ONDANSETRON HCL 4 MG/2ML IJ SOLN
INTRAMUSCULAR | Status: AC
  Filled 2020-03-28: qty 2

## 2020-03-28 MED ORDER — BUPIVACAINE-EPINEPHRINE (PF) 0.25% -1:200000 IJ SOLN
INTRAMUSCULAR | Status: DC | PRN
  Administered 2020-03-28: 30 mL via PERINEURAL

## 2020-03-28 SURGICAL SUPPLY — 35 items
BUR RADIUS 3.5 (BURR) ×3 IMPLANT
BUR RADIUS 4.0X18.5 (BURR) ×3 IMPLANT
CLOSURE WOUND 1/2 X4 (GAUZE/BANDAGES/DRESSINGS) ×1
COVER WAND RF STERILE (DRAPES) ×3 IMPLANT
CUFF TOURN 24 STER (MISCELLANEOUS) IMPLANT
CUFF TOURN 30 STER DUAL PORT (MISCELLANEOUS) ×3 IMPLANT
DRAPE IMP U-DRAPE 54X76 (DRAPES) ×3 IMPLANT
DURAPREP 26ML APPLICATOR (WOUND CARE) ×6 IMPLANT
GAUZE SPONGE 4X4 12PLY STRL (GAUZE/BANDAGES/DRESSINGS) ×3 IMPLANT
GAUZE XEROFORM 1X8 LF (GAUZE/BANDAGES/DRESSINGS) ×3 IMPLANT
GLOVE BIOGEL PI IND STRL 9 (GLOVE) ×1 IMPLANT
GLOVE BIOGEL PI INDICATOR 9 (GLOVE) ×2
GLOVE SURG 9.0 ORTHO LTXF (GLOVE) ×6 IMPLANT
GOWN STRL REUS W/ TWL LRG LVL3 (GOWN DISPOSABLE) ×1 IMPLANT
GOWN STRL REUS W/TWL 2XL LVL3 (GOWN DISPOSABLE) ×3 IMPLANT
GOWN STRL REUS W/TWL LRG LVL3 (GOWN DISPOSABLE) ×3
IV LACTATED RINGER IRRG 3000ML (IV SOLUTION) ×18
IV LR IRRIG 3000ML ARTHROMATIC (IV SOLUTION) ×6 IMPLANT
KIT TURNOVER KIT A (KITS) ×3 IMPLANT
MANIFOLD NEPTUNE II (INSTRUMENTS) ×3 IMPLANT
MAT ABSORB  FLUID 56X50 GRAY (MISCELLANEOUS) ×2
MAT ABSORB FLUID 56X50 GRAY (MISCELLANEOUS) ×1 IMPLANT
NEEDLE HYPO 22GX1.5 SAFETY (NEEDLE) ×3 IMPLANT
PACK ARTHROSCOPY KNEE (MISCELLANEOUS) ×3 IMPLANT
PAD ABD DERMACEA PRESS 5X9 (GAUZE/BANDAGES/DRESSINGS) ×6 IMPLANT
SET TUBE SUCT SHAVER OUTFL 24K (TUBING) ×3 IMPLANT
SET TUBE TIP INTRA-ARTICULAR (MISCELLANEOUS) ×3 IMPLANT
SOL PREP PVP 2OZ (MISCELLANEOUS) ×3
SOLUTION PREP PVP 2OZ (MISCELLANEOUS) ×1 IMPLANT
STRIP CLOSURE SKIN 1/2X4 (GAUZE/BANDAGES/DRESSINGS) ×2 IMPLANT
SUT ETHILON 4-0 (SUTURE) ×3
SUT ETHILON 4-0 FS2 18XMFL BLK (SUTURE) ×1
SUTURE ETHLN 4-0 FS2 18XMF BLK (SUTURE) ×1 IMPLANT
TUBING ARTHRO INFLOW-ONLY STRL (TUBING) ×3 IMPLANT
WAND HAND CNTRL MULTIVAC 90 (MISCELLANEOUS) ×3 IMPLANT

## 2020-03-28 NOTE — Op Note (Signed)
  PATIENT:  Perry Hansen  PRE-OPERATIVE DIAGNOSIS:  TEAR OF MEDIAL MENISCUS, LEFT KNEE  POST-OPERATIVE DIAGNOSIS:  TEAR OF MEDIAL MENISCUS, DIFFUSE CHONDRAL WEAR MEDIAL FEMORAL CONDYLE AND CHONDRAL LOOSE BODIES, LEFT KNEE   PROCEDURE:  LEFT KNEE ARTHROSCOPY WITH  Partial MEDIAL MENISECTOMY, chondroplasty of medial femoral condyle and removal of chondral loose bodies  SURGEON:  Thornton Park, MD  ANESTHESIA:   General  PREOPERATIVE INDICATIONS:  Perry Hansen  49 y.o. male with a diagnosis of TEAR OF MEDIAL MENISCUS, confirmed by MRI, who failed conservative management and elected for surgical management.    The risks benefits and alternatives were discussed with the patient preoperatively including the risks of infection, bleeding, nerve injury, knee stiffness, persistent pain, osteoarthritis and the need for further surgery. Medical  risks include DVT and pulmonary embolism, myocardial infarction, stroke, pneumonia, respiratory failure and death. The patient understood these risks and wished to proceed.  OPERATIVE FINDINGS: Complex tear of the posterior horn primarily involving a horizontal tear, extending towards the posterior meniscal root without meniscal root detachment.  OPERATIVE PROCEDURE: Patient was met in the preoperative area. The operative extremity was signed with the word yes and my initials according the hospital's correct site of surgery protocol.  The patient was brought to the operating room where they was placed supine on the operative table. General anesthesia was administered. The patient was prepped and draped in a sterile fashion.  A timeout was performed to verify the patient's name, date of birth, medical record number, correct site of surgery correct procedure to be performed. It was also used to verify the patient received antibiotics that all appropriate instruments, and radiographic studies were available in the room. Once all in attendance were in agreement,  the case began.  Proposed arthroscopy incisions were drawn out with a surgical marker. These were pre-injected with 1% lidocaine plain. An 11 blade was used to establish an inferior lateral and inferomedial portals. The inferomedial portal was created using a 18-gauge spinal needle under direct visualization.  A full diagnostic examination of the knee was performed including the suprapatellar pouch, patellofemoral joint, medial lateral compartments as well as the medial lateral gutters, the intercondylar notch in the posterior knee.  Findings on arthroscopy included diffuse chondral wear of the medial femoral condyle, complex tear of the posterior horn of the medial meniscus, chondral loose bodies x2.  Patient's meniscal tear was probed and found to be unstable.  Patient had the unstable medial meniscal tear treated with a 4-0 resector shaver blade and straight duckbill basket. The meniscus was debrided until a stable rim was achieved. A chondroplasty of the medial femoral condyle was also performed using a 4-0 resector shaver blade.  Two chondral loose bodies were removed with a 4.0 resector shaver blade.  The knee was then copiously lavaged. All arthroscopic instruments were removed. The two arthroscopy portals were closed with 4-0 nylon. Steri-Strips were applied along with a dry sterile and compressive dressing. The patient was brought to the PACU in stable condition. I was scrubbed and present for the entire case and all sharp and instrument counts were correct at the conclusion the case. I spoke with the patient's wife postoperatively to let her know the case was performed without complication and the patient was stable in the recovery room.    Timoteo Gaul, MD

## 2020-03-28 NOTE — Transfer of Care (Signed)
Immediate Anesthesia Transfer of Care Note  Patient: Perry Hansen  Procedure(s) Performed: LEFT KNEE ARTHROSCOPY WITH PARTIAL MEDIAL MENISECTOMY (Left Knee)  Patient Location: PACU  Anesthesia Type:General  Level of Consciousness: drowsy  Airway & Oxygen Therapy: Patient Spontanous Breathing and Patient connected to face mask oxygen  Post-op Assessment: Report given to RN and Post -op Vital signs reviewed and stable  Post vital signs: Reviewed and stable  Last Vitals:  Vitals Value Taken Time  BP 129/90 03/28/20 0927  Temp    Pulse 87 03/28/20 0930  Resp 13 03/28/20 0930  SpO2 97 % 03/28/20 0930  Vitals shown include unvalidated device data.  Last Pain:  Vitals:   03/28/20 0617  TempSrc: Oral  PainSc: 4          Complications: No complications documented.

## 2020-03-28 NOTE — Anesthesia Procedure Notes (Signed)
Procedure Name: LMA Insertion Date/Time: 03/28/2020 8:05 AM Performed by: Lynden Oxford, CRNA Pre-anesthesia Checklist: Patient identified, Emergency Drugs available, Suction available and Patient being monitored Patient Re-evaluated:Patient Re-evaluated prior to induction Oxygen Delivery Method: Circle system utilized Preoxygenation: Pre-oxygenation with 100% oxygen Induction Type: IV induction Ventilation: Mask ventilation without difficulty LMA: LMA inserted LMA Size: 4.5 Tube type: Oral Number of attempts: 1 Airway Equipment and Method: Patient positioned with wedge pillow Placement Confirmation: positive ETCO2 and breath sounds checked- equal and bilateral Tube secured with: Tape Dental Injury: Teeth and Oropharynx as per pre-operative assessment

## 2020-03-28 NOTE — Progress Notes (Addendum)
EKG per Dr. Carson Myrtle, completed at this time and reviewed  By Dr. Carson Myrtle and aware of BP 160/104 in pre-op

## 2020-03-28 NOTE — Discharge Instructions (Signed)

## 2020-03-28 NOTE — Anesthesia Preprocedure Evaluation (Signed)
Anesthesia Evaluation  Patient identified by MRN, date of birth, ID band Patient awake    Reviewed: Allergy & Precautions, H&P , NPO status , Patient's Chart, lab work & pertinent test results, reviewed documented beta blocker date and time   Airway Mallampati: II  TM Distance: >3 FB Neck ROM: full    Dental  (+) Teeth Intact   Pulmonary neg pulmonary ROS, Current Smoker,    Pulmonary exam normal        Cardiovascular Exercise Tolerance: Good hypertension, On Medications negative cardio ROS Normal cardiovascular exam Rate:Normal     Neuro/Psych negative neurological ROS  negative psych ROS   GI/Hepatic Neg liver ROS, GERD  Medicated,  Endo/Other  negative endocrine ROSdiabetes, Type 2, Oral Hypoglycemic Agents  Renal/GU negative Renal ROS  negative genitourinary   Musculoskeletal   Abdominal   Peds  Hematology negative hematology ROS (+)   Anesthesia Other Findings   Reproductive/Obstetrics negative OB ROS                             Anesthesia Physical Anesthesia Plan  ASA: III  Anesthesia Plan: General LMA   Post-op Pain Management:    Induction:   PONV Risk Score and Plan: 2  Airway Management Planned:   Additional Equipment:   Intra-op Plan:   Post-operative Plan:   Informed Consent: I have reviewed the patients History and Physical, chart, labs and discussed the procedure including the risks, benefits and alternatives for the proposed anesthesia with the patient or authorized representative who has indicated his/her understanding and acceptance.       Plan Discussed with: CRNA  Anesthesia Plan Comments:         Anesthesia Quick Evaluation

## 2020-03-28 NOTE — H&P (Signed)
PREOPERATIVE H&P  Chief Complaint: Left knee medial meniscal tear  HPI: Perry Hansen is a 49 y.o. male who presents for preoperative history and physical with a diagnosis of Left knee medial meniscal tear confirmed by MRI. Symptoms of pain and mechanical symptoms are significantly impairing activities of daily living.  He has agreed with surgery for an arthroscopic partial medial meniscectomy.   Past Medical History:  Diagnosis Date  . Chronic urticaria   . Diabetes mellitus without complication (HCC)   . Edema   . GERD (gastroesophageal reflux disease)   . History of kidney stones   . Hypertension    Past Surgical History:  Procedure Laterality Date  . ABDOMINAL SURGERY  1975   surgery on stomach as a kid because he swallowed a marble  . ANTERIOR CRUCIATE LIGAMENT REPAIR Left 03/02/2019   Procedure: LEFT KNEE ARTHROSCOPY,LEFT KNEE ANTERIOR CRUCIATE LIGAMENT RECONSTRUCTION WITH AUTOGRAFT AND MEDIAL MENISCAL REPAIR;  Surgeon: Juanell Fairly, MD;  Location: [facility] ORS;  Service: Orthopedics;  Laterality: Left;  . SHOULDER ARTHROSCOPY Right 2010   Social History   Socioeconomic History  . Marital status: Married    Spouse name: Cordelia Pen  . Number of children: Not on file  . Years of education: Not on file  . Highest education level: Not on file  Occupational History  . Occupation: foreman  Tobacco Use  . Smoking status: Current Every Day Smoker    Packs/day: 1.00    Years: 32.00    Pack years: 32.00    Types: Cigarettes  . Smokeless tobacco: Never Used  Vaping Use  . Vaping Use: Never used  Substance and Sexual Activity  . Alcohol use: Never  . Drug use: Never  . Sexual activity: Not on file  Other Topics Concern  . Not on file  Social History Narrative  . Not on file   Social Determinants of Health   Financial Resource Strain:   . Difficulty of Paying Living Expenses: Not on file  Food Insecurity:   . Worried About Programme researcher, broadcasting/film/video in the Last Year: Not on file   . Ran Out of Food in the Last Year: Not on file  Transportation Needs:   . Lack of Transportation (Medical): Not on file  . Lack of Transportation (Non-Medical): Not on file  Physical Activity:   . Days of Exercise per Week: Not on file  . Minutes of Exercise per Session: Not on file  Stress:   . Feeling of Stress : Not on file  Social Connections:   . Frequency of Communication with Friends and Family: Not on file  . Frequency of Social Gatherings with Friends and Family: Not on file  . Attends Religious Services: Not on file  . Active Member of Clubs or Organizations: Not on file  . Attends Banker Meetings: Not on file  . Marital Status: Not on file   History reviewed. No pertinent family history. Allergies  Allergen Reactions  . Meloxicam Nausea And Vomiting  . Naproxen Hives   Prior to Admission medications   Medication Sig Start Date End Date Taking? Authorizing Provider  acetaminophen (TYLENOL) 500 MG tablet Take 1,000 mg by mouth every 6 (six) hours as needed (for pain.).   Yes [provider]  diphenhydrAMINE (BENADRYL) 25 MG tablet Take 25 mg by mouth every 6 (six) hours as needed.   Yes [provider]  hydrochlorothiazide (HYDRODIURIL) 25 MG tablet Take 25 mg by mouth daily.   Yes [provider]  ibuprofen (ADVIL) 200 MG tablet Take 800 mg by mouth every 8 (eight) hours as needed (for pain.).   Yes [provider]  lisinopril (ZESTRIL) 5 MG tablet Take 5 mg by mouth daily. 02/24/20 02/23/21 Yes [provider]  loratadine (CLARITIN) 10 MG tablet Take 10 mg by mouth daily.   Yes [provider]  lovastatin (MEVACOR) 10 MG tablet Take 10 mg by mouth every evening. 01/28/20 04/27/20 Yes [provider]  metFORMIN (GLUCOPHAGE) 1000 MG tablet Take 1,000 mg by mouth in the morning and at bedtime. 01/28/20 01/27/21 Yes [provider]  pantoprazole (PROTONIX) 20 MG tablet Take 20 mg by mouth daily.  02/24/20 02/23/21 Yes [provider]  predniSONE (DELTASONE) 10 MG tablet Take 10 mg by mouth daily as needed (chronic urticaria).    Yes [provider]     Positive ROS: All other systems have been reviewed and were otherwise negative with the exception of those mentioned in the HPI and as above.  Physical Exam: General: Alert, no acute distress Cardiovascular: Regular rate and rhythm, no murmurs rubs or gallops.  No pedal edema Respiratory: Clear to auscultation bilaterally, no wheezes rales or rhonchi. No cyanosis, no use of accessory musculature GI: No organomegaly, abdomen is soft and non-tender nondistended with positive bowel sounds. Skin: Skin intact, no lesions within the operative field. Neurologic: Sensation intact distally Psychiatric: Patient is competent for consent with normal mood and affect Lymphatic: No cervical lymphadenopathy  MUSCULOSKELETAL: Left knee: Patient has well-healed previous incisions.  He has no erythema ecchymosis or significant effusion.  His range of motion is from 0-1 110 to 115 degrees.  He has tenderness over the medial joint line and a positive McMurray's test.  Distally is neurovascular intact.  He has no ligamentous laxity.  Assessment: Left knee medial meniscal tear  Plan: Plan for Procedure(s): LEFT KNEE ARTHROSCOPY WITH PARTIAL MEDIAL MENISECTOMY  I reviewed the details of the operation as well as the postoperative course with the patient.  I discussed the risks and benefits of surgery. The risks include but are not limited to infection, bleeding, nerve or blood vessel injury, joint stiffness or loss of motion, persistent pain, weakness or instability, retear of the meniscus, osteoarthritis and the need for further surgery. Medical risks include but are not limited to DVT and pulmonary embolism, myocardial infarction, stroke, pneumonia, respiratory failure and death. Patient understood these risks and wished to proceed.      Juanell Fairly, MD   03/28/2020 7:55 AM

## 2020-03-29 ENCOUNTER — Encounter: Payer: Self-pay | Admitting: Orthopedic Surgery

## 2020-03-30 NOTE — Anesthesia Postprocedure Evaluation (Signed)
Anesthesia Post Note  Patient: Perry Hansen  Procedure(s) Performed: LEFT KNEE ARTHROSCOPY WITH PARTIAL MEDIAL MENISECTOMY, CHONDROPLASTY MEDIAL FEMORAL CHONDYLE, EXCISION OF LOOSE BODIES (Left Knee)  Patient location during evaluation: PACU Anesthesia Type: General Level of consciousness: awake and alert Pain management: pain level controlled Vital Signs Assessment: post-procedure vital signs reviewed and stable Respiratory status: spontaneous breathing, nonlabored ventilation, respiratory function stable and patient connected to nasal cannula oxygen Cardiovascular status: blood pressure returned to baseline and stable Postop Assessment: no apparent nausea or vomiting Anesthetic complications: no   No complications documented.   Last Vitals:  Vitals:   03/28/20 0958 03/28/20 1020  BP: 136/83 133/80  Pulse: (!) 101 92  Resp: 18 16  Temp:  36.4 C  SpO2: 93% 97%    Last Pain:  Vitals:   03/29/20 0858  TempSrc:   PainSc: 2                  Yevette Edwards

## 2021-01-30 IMAGING — DX LEFT KNEE - COMPLETE 4+ VIEW
4 series · 4 of 4 positions shown · non-contrast
Comparison: 10/17/2018

CLINICAL DATA: Mechanical fall.

EXAM:
LEFT KNEE - COMPLETE 4+ VIEW

[knee ap]
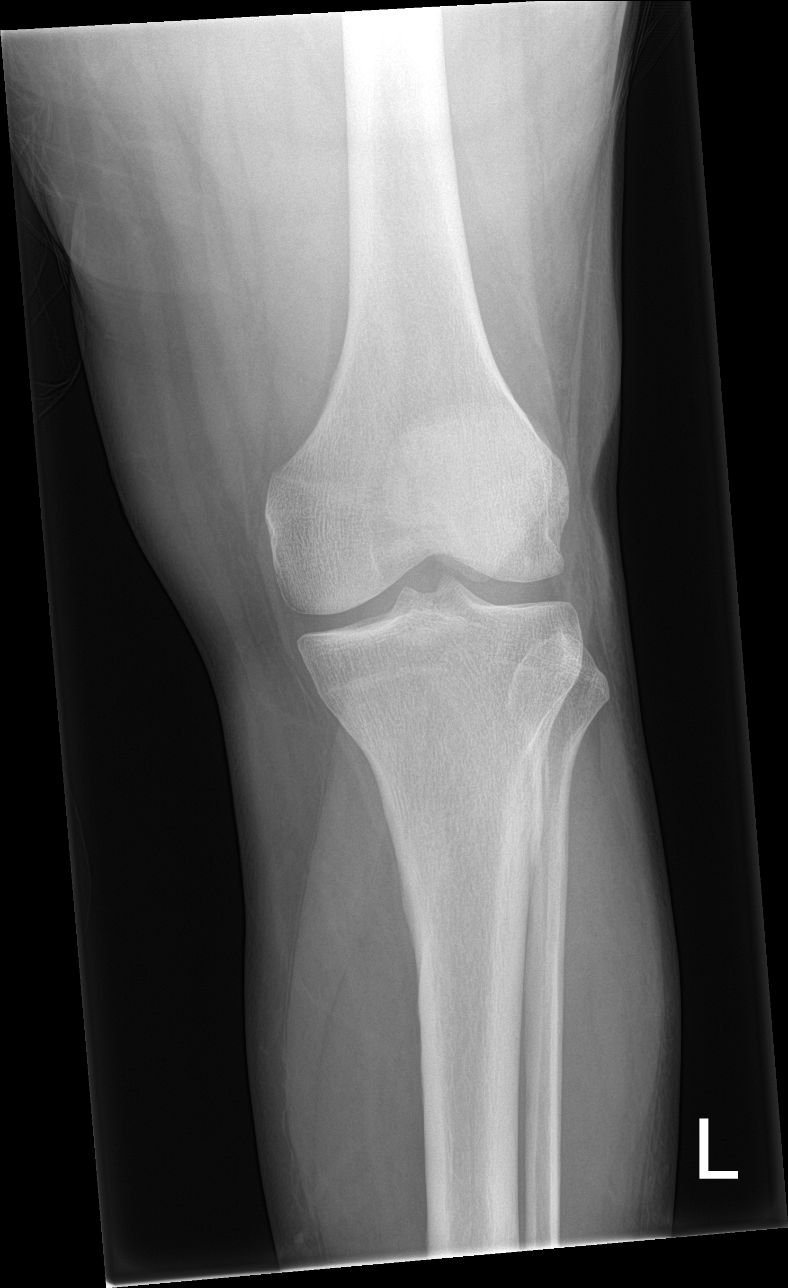

[knee obl (1 of 2)]
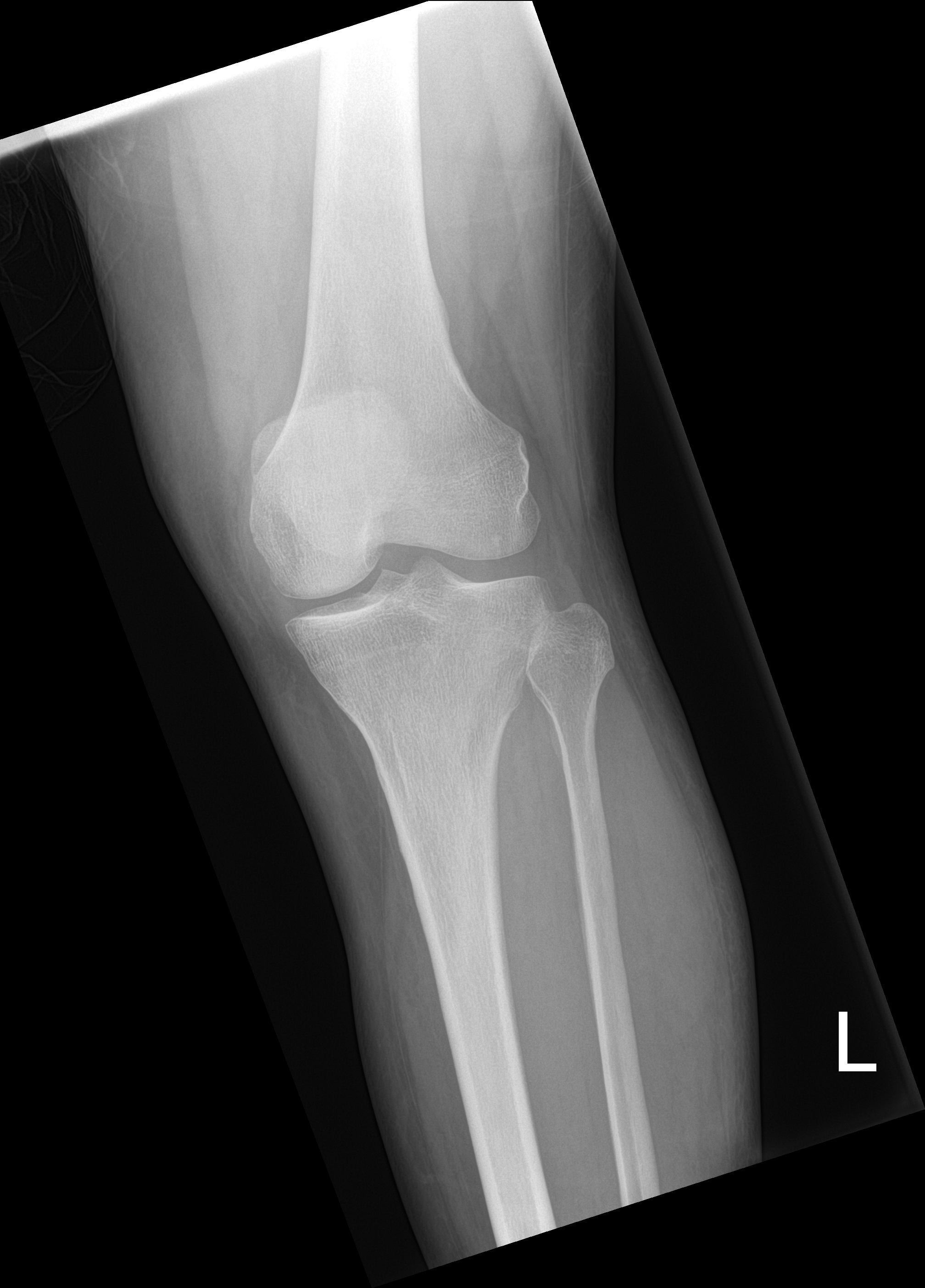

[knee obl (2 of 2)]
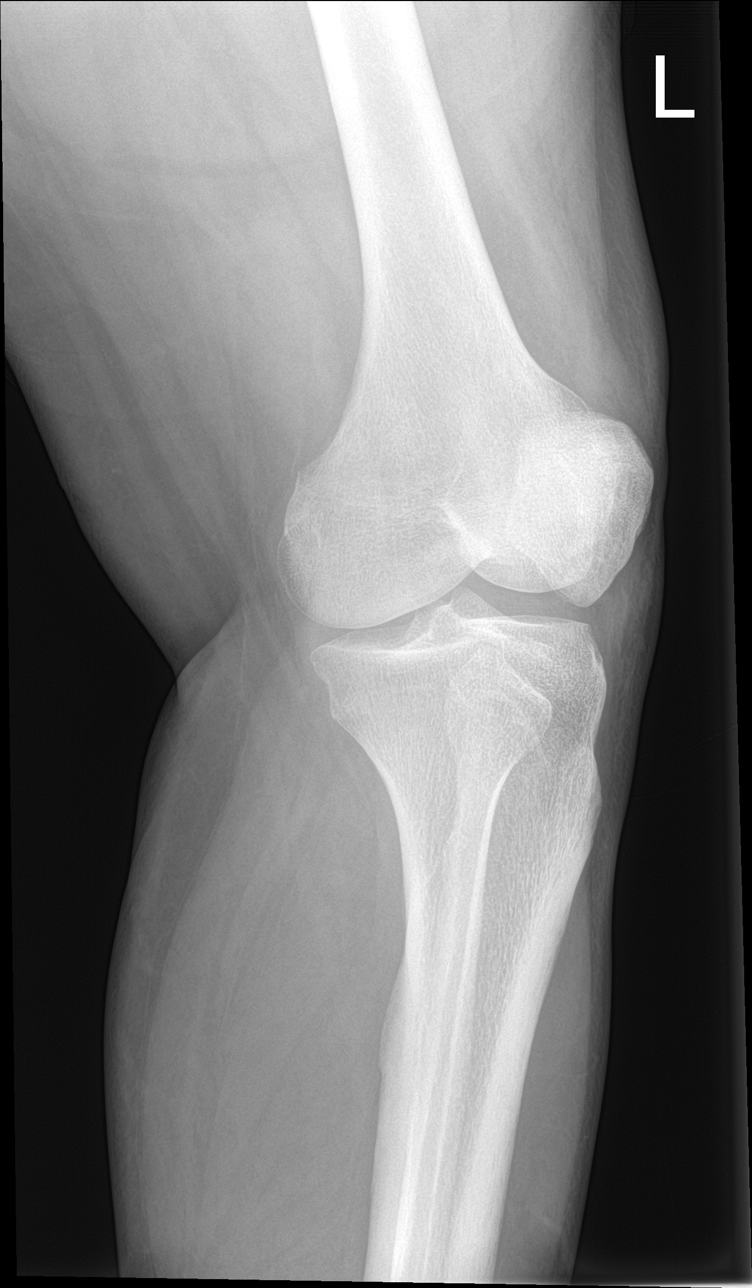

[knee lat]
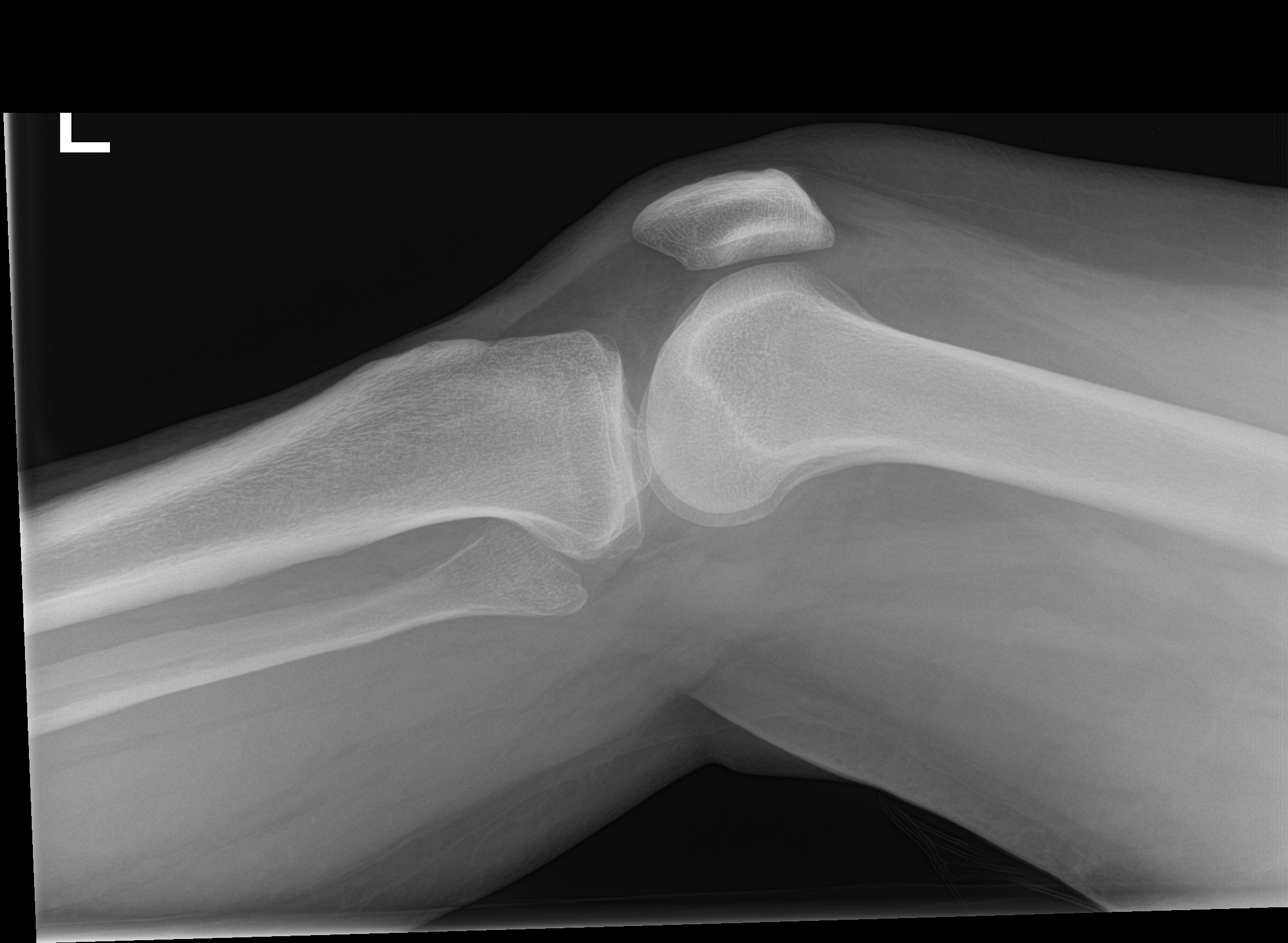

[4 of 4 positions shown; findings below may reference images not displayed]

FINDINGS: Small joint effusion. No fracture or malalignment. No degenerative
spurring.
IMPRESSION: Small joint effusion without osseous abnormality.

## 2021-11-01 DIAGNOSIS — M1712 Unilateral primary osteoarthritis, left knee: Secondary | ICD-10-CM

## 2021-11-01 DIAGNOSIS — K219 Gastro-esophageal reflux disease without esophagitis: Secondary | ICD-10-CM | POA: Insufficient documentation

## 2021-11-01 DIAGNOSIS — R454 Irritability and anger: Secondary | ICD-10-CM

## 2021-11-01 DIAGNOSIS — M62830 Muscle spasm of back: Secondary | ICD-10-CM | POA: Insufficient documentation

## 2021-11-01 DIAGNOSIS — M13811 Other specified arthritis, right shoulder: Secondary | ICD-10-CM | POA: Insufficient documentation

## 2021-11-01 DIAGNOSIS — R351 Nocturia: Secondary | ICD-10-CM

## 2021-11-01 HISTORY — DX: Other specified arthritis, right shoulder: M13.811

## 2021-11-01 HISTORY — DX: Nocturia: R35.1

## 2021-11-01 HISTORY — DX: Unilateral primary osteoarthritis, left knee: M17.12

## 2021-11-01 HISTORY — DX: Irritability and anger: R45.4

## 2021-11-06 DIAGNOSIS — F5101 Primary insomnia: Secondary | ICD-10-CM | POA: Insufficient documentation

## 2022-10-23 ENCOUNTER — Other Ambulatory Visit: Payer: Self-pay

## 2022-10-23 ENCOUNTER — Emergency Department
Admission: EM | Admit: 2022-10-23 | Discharge: 2022-10-23 | Disposition: A | Discharge status: Home / Self Care | Payer: Self-pay | Attending: Emergency Medicine | Admitting: Emergency Medicine

## 2022-10-23 ENCOUNTER — Emergency Department: Payer: Self-pay

## 2022-10-23 DIAGNOSIS — Y990 Civilian activity done for income or pay: Secondary | ICD-10-CM | POA: Insufficient documentation

## 2022-10-23 DIAGNOSIS — W1842XA Slipping, tripping and stumbling without falling due to stepping into hole or opening, initial encounter: Secondary | ICD-10-CM | POA: Insufficient documentation

## 2022-10-23 DIAGNOSIS — S93601A Unspecified sprain of right foot, initial encounter: Secondary | ICD-10-CM | POA: Insufficient documentation

## 2022-10-23 DIAGNOSIS — E119 Type 2 diabetes mellitus without complications: Secondary | ICD-10-CM | POA: Insufficient documentation

## 2022-10-23 DIAGNOSIS — S99921A Unspecified injury of right foot, initial encounter: Secondary | ICD-10-CM

## 2022-10-23 DIAGNOSIS — I10 Essential (primary) hypertension: Secondary | ICD-10-CM | POA: Insufficient documentation

## 2022-10-23 NOTE — ED Provider Notes (Incomplete)
[facility] Emergency Department Provider Note     None    (approximate)   History   Foot Injury   HPI  Perry Hansen is a 52 y.o. male presents to the emergency department for evaluation of his right foot following a work injury yesterday.  Patient reports while working he stepped into a hole in the ground and rolled his foot.  He heard a pop and suddenly felt a sharp like pain in the top of his right foot.  While at work today he reports his pain in his foot progressed.  He reports walking and plantarflexion makes the pain worse.  No past injury history or surgical history to affected foot.  No other injuries to report at this time.  He denies numbness and tingling.      Physical Exam   Triage Vital Signs: ED Triage Vitals  Enc Vitals Group     BP 10/23/22 1952 (!) 150/86     Pulse Rate 10/23/22 1952 85     Resp 10/23/22 1952 18     Temp 10/23/22 1952 98.2 F (36.8 C)     Temp Source 10/23/22 1952 Oral     SpO2 10/23/22 1952 96 %     Weight 10/23/22 1953 235 lb (106.6 kg)     Height 10/23/22 1953 5\' 9"  (1.753 m)     Head Circumference --      Peak Flow --      Pain Score 10/23/22 1953 8     Pain Loc --      Pain Edu? --      Excl. in GC? --     Most recent vital signs: Vitals:   10/23/22 1952  BP: (!) 150/86  Pulse: 85  Resp: 18  Temp: 98.2 F (36.8 C)  SpO2: 96%   General: Dual. Alert and oriented. INAD.  Skin:  Warm, dry and intact. No rashes or lesions noted.     Head:  NCAT.  Eyes:  PERRLA. EOMI. Conjunctivae clear. Ears:  EACs patent w/o inflammation. TMs translucent. No bulging, erythema, or effusion.  Nose:   Nasal septum is midline. Mucosa is moist. No rhinorrhea. Mouth/Throat:Uvula is midline. No tonsillar swelling or exudates. No erythema. Neck:   Supple. No cervical spine tenderness to palpation. No lymphadenopathy.  CV:  Good peripheral perfusion. RRR.  RESP:  Normal effort. LCTAB. No retractions.  ABD:  No  distention. NBSx4. Soft. No tenderness to palpation. No CVA tenderness.  BACK:  Spinous process is midline without deformity or tenderness.  MSK:   Patient freely moves all extremities. No warm swollen joints. No peripheral edema.  NEURO: Cranial nerves II-XII intact. No focal deficits. Sensation and motor function intact.       ED Results / Procedures / Treatments   Labs (all labs ordered are listed, but only abnormal results are displayed) Labs Reviewed - No data to display   EKG  ***  RADIOLOGY  {**I personally viewed and evaluated these images as part of my medical decision making, as well as reviewing the written report by the radiologist.  ED Provider Interpretation: ***  DG Foot Complete Right  Result Date: 10/23/2022 CLINICAL DATA:  Patient stepped in a hole.  Foot injury and pain. EXAM: RIGHT FOOT COMPLETE - 3+ VIEW COMPARISON:  None Available. FINDINGS: There is no evidence of fracture or dislocation. There is no evidence of arthropathy or other focal bone abnormality. Soft tissues are unremarkable. IMPRESSION: Negative. Electronically Signed  By: Kennith Center M.D.   On: 10/23/2022 20:33     PROCEDURES:  Critical Care performed: {CriticalCareYesNo:19197::"Yes, see critical care procedure note(s)","No"}  Procedures   MEDICATIONS ORDERED IN ED: Medications - No data to display   IMPRESSION / MDM / ASSESSMENT AND PLAN / ED COURSE  I reviewed the triage vital signs and the nursing notes.                              Differential diagnosis includes, but is not limited to, ***  Patient's presentation is most consistent with {EM COPA:27473}  {**The patient is on the cardiac monitor to evaluate for evidence of arrhythmia and/or significant heart rate changes.**}  Patient's diagnosis is consistent with ***. Patient will be discharged home with prescriptions for ***. Patient is to follow up with *** as needed or otherwise directed. Patient is given ED precautions to  return to the ED for any worsening or new symptoms.     FINAL CLINICAL IMPRESSION(S) / ED DIAGNOSES   Final diagnoses:  Injury of right foot, initial encounter  Sprain of right foot, initial encounter     Rx / DC Orders   ED Discharge Orders     None        Note:  This document was prepared using Dragon voice recognition software and may include unintentional dictation errors.

## 2022-10-23 NOTE — ED Triage Notes (Addendum)
Pt presents to ER with c/o right foot injury that happened yesterday while working.  Pt states he stepped in a hole while at work, and heard a pop.  Pt reports today, he went back to work, and was able to work all day, but is now reporting more pain to his foot with increased swelling noted to the top of right foot.  States pain is worsened when foot is rolling forward.  Pt denies other injuries, but is otherwise A&O x4 and in NAD.

## 2022-10-23 NOTE — Discharge Instructions (Addendum)
Take over-the-counter Tylenol or Ibuprofen with food or snacks as needed for pain.Take care and be patient!  Rest, Elevate foot as much as possible and apply ice to the affected area.

## 2022-10-23 NOTE — ED Provider Notes (Signed)
[facility] Emergency Department Provider Note     None    (approximate)   History   Foot Injury   HPI  Perry Hansen is a 52 y.o. male with a past medical history of hypertension diabetes, presents to the emergency department for evaluation of his right foot following a injury yesterday.  Patient reports while working he stepped into a hole in the ground and injured his foot.  He heard a pop and suddenly felt a sharp pain in the top of his right foot.  While at work today he reports his pain increased.  He reports pain is worse when walking and pointing foot down. No past injuries or surgical history to affected foot.  No other injuries to report at this time.  He denies numbness and tingling.      Physical Exam   Triage Vital Signs: ED Triage Vitals  Enc Vitals Group     BP 10/23/22 1952 (!) 150/86     Pulse Rate 10/23/22 1952 85     Resp 10/23/22 1952 18     Temp 10/23/22 1952 98.2 F (36.8 C)     Temp Source 10/23/22 1952 Oral     SpO2 10/23/22 1952 96 %     Weight 10/23/22 1953 235 lb (106.6 kg)     Height 10/23/22 1953 5\' 9"  (1.753 m)     Head Circumference --      Peak Flow --      Pain Score 10/23/22 1953 8     Pain Loc --      Pain Edu? --      Excl. in GC? --     Most recent vital signs: Vitals:   10/23/22 1952  BP: (!) 150/86  Pulse: 85  Resp: 18  Temp: 98.2 F (36.8 C)  SpO2: 96%   General: Alert and oriented. INAD.  Skin:  Warm, dry and intact. No rashes or lesions noted.     HEENT: NCAT. PERRL. EOMI.  CV:  Good peripheral perfusion. RRR.  RESP:  Normal effort.  ABD:  No distention.  OTHER: Right foot reveals mild edema.  No ecchymosis noted.  Normal color.  Sensation and motor function intact.  Plantarflexion limited secondary to pain otherwise full active range of motion.  Neurovascular status intact.  Ambulation with a limp.       ED Results / Procedures / Treatments   Labs (all labs ordered are listed, but only  abnormal results are displayed) Labs Reviewed - No data to display  RADIOLOGY I personally viewed and evaluated these images as part of my medical decision making, as well as reviewing the written report by the radiologist.  ED Provider Interpretation: Right foot x-ray unremarkable.  DG Foot Complete Right  Result Date: 10/23/2022 CLINICAL DATA:  Patient stepped in a hole.  Foot injury and pain. EXAM: RIGHT FOOT COMPLETE - 3+ VIEW COMPARISON:  None Available. FINDINGS: There is no evidence of fracture or dislocation. There is no evidence of arthropathy or other focal bone abnormality. Soft tissues are unremarkable. IMPRESSION: Negative. Electronically Signed   By: Kennith Center M.D.   On: 10/23/2022 20:33     PROCEDURES:  Critical Care performed: No  Procedures   MEDICATIONS ORDERED IN ED: Medications - No data to display   IMPRESSION / MDM / ASSESSMENT AND PLAN / ED COURSE  I reviewed the triage vital signs and the nursing notes.  Differential diagnosis includes, but is not limited to, fracture, dislocation, sprain, strain, tendinitis, gout doubt but considered.  Patient's presentation is most consistent with acute complicated illness / injury requiring diagnostic workup.  52 year old male presents to the emergency department with acute right foot pain following work injury.  Primary assessment is reassuring of any life-threatening conditions.  Vital signs stable.  X-ray ordered and is reassuring.  Given patient's overall exam findings, he is appropriate for outpatient follow-up and discharge.  I instructed him to rest his foot and stay off of it as much as he can.  He can apply ice to the area to reduce swelling.  I also educated patient on compression socks that can be bought over-the-counter.  Patient's medical condition is stable and appropriate for discharge at this time without further workup.  Patient placed in Aircast boot and advised to take  Tylenol and ibuprofen for pain as needed.  Patient's diagnosis is consistent with sprain. Patient is to follow up with primary care physician as needed or otherwise directed. Patient is given ED precautions to return to the ED for any worsening or new symptoms.     FINAL CLINICAL IMPRESSION(S) / ED DIAGNOSES   Final diagnoses:  Injury of right foot, initial encounter  Sprain of right foot, initial encounter     Rx / DC Orders   ED Discharge Orders     None        Note:  This document was prepared using Dragon voice recognition software and may include unintentional dictation errors.    Romeo Apple, Leonela Kivi A, PA-C 10/24/22 1610    Pilar Jarvis, MD 10/24/22 [phone]

## 2023-08-21 ENCOUNTER — Ambulatory Visit: Payer: Self-pay | Admitting: Pediatrics

## 2023-08-21 ENCOUNTER — Encounter: Payer: Self-pay | Admitting: Pediatrics

## 2023-08-21 VITALS — BP 154/82 | HR 76 | Temp 98.1°F | Ht 67.8 in | Wt 242.4 lb

## 2023-08-21 DIAGNOSIS — R400 Somnolence: Secondary | ICD-10-CM | POA: Diagnosis not present

## 2023-08-21 DIAGNOSIS — Z1330 Encounter for screening examination for mental health and behavioral disorders, unspecified: Secondary | ICD-10-CM

## 2023-08-21 DIAGNOSIS — I10 Essential (primary) hypertension: Secondary | ICD-10-CM

## 2023-08-21 DIAGNOSIS — E119 Type 2 diabetes mellitus without complications: Secondary | ICD-10-CM | POA: Diagnosis not present

## 2023-08-21 DIAGNOSIS — F17210 Nicotine dependence, cigarettes, uncomplicated: Secondary | ICD-10-CM

## 2023-08-21 DIAGNOSIS — Z125 Encounter for screening for malignant neoplasm of prostate: Secondary | ICD-10-CM

## 2023-08-21 DIAGNOSIS — Z13220 Encounter for screening for lipoid disorders: Secondary | ICD-10-CM

## 2023-08-21 DIAGNOSIS — Z131 Encounter for screening for diabetes mellitus: Secondary | ICD-10-CM

## 2023-08-21 DIAGNOSIS — Z1211 Encounter for screening for malignant neoplasm of colon: Secondary | ICD-10-CM

## 2023-08-21 DIAGNOSIS — F5101 Primary insomnia: Secondary | ICD-10-CM

## 2023-08-21 DIAGNOSIS — R5383 Other fatigue: Secondary | ICD-10-CM

## 2023-08-21 DIAGNOSIS — M2550 Pain in unspecified joint: Secondary | ICD-10-CM

## 2023-08-21 DIAGNOSIS — Z7689 Persons encountering health services in other specified circumstances: Secondary | ICD-10-CM

## 2023-08-21 DIAGNOSIS — N5089 Other specified disorders of the male genital organs: Secondary | ICD-10-CM

## 2023-08-21 LAB — MICROALBUMIN, URINE WAIVED
Creatinine, Urine Waived: 100 mg/dL (ref 10–300)
Microalb, Ur Waived: 30 mg/L — ABNORMAL HIGH (ref 0–19)
Microalb/Creat Ratio: <30 mg/g (ref <30)

## 2023-08-21 MED ORDER — TRAZODONE HCL 50 MG PO TABS
25.0000 mg | ORAL_TABLET | Freq: Every evening | ORAL | 3 refills | Status: AC | PRN
Start: 2023-08-21 — End: ?

## 2023-08-21 NOTE — Progress Notes (Signed)
 Establish Care Note  BP (!) 154/82 (BP Location: Left Arm, Cuff Size: Large)   Pulse 76   Temp 98.1 F (36.7 C) (Oral)   Ht 5' 7.8" (1.722 m)   Wt 242 lb 6.4 oz (110 kg)   SpO2 93%   BMI 37.07 kg/m    Subjective:    Patient ID: Perry Hansen, male    DOB: 1971-03-27, 53 y.o.   MRN: [id number]  HPI: Perry Hansen is a 53 y.o. male  Chief Complaint  Patient presents with   Anxiety   Diabetes   Hyperlipidemia   Hypertension   Arthritis   Fatigue    Establishing care, the following was discussed today:  Discussed the use of AI scribe software for clinical note transcription with the patient, who gave verbal consent to proceed.  History of Present Illness   Perry Hansen is a 53 year old male with type 2 diabetes who presents with fatigue and joint pain.  He experiences significant fatigue and joint pain, primarily affecting his knees, back, elbows, and shoulder. The joint pain has progressively worsened over the past six months, severely impacting his daily activities and sleep. He describes the pain as severe, making it difficult to get out of bed at times. He has a history of knee and shoulder surgeries, including ACL surgery and rotator cuff repair.  Following the ACL surgery, he developed type 2 diabetes, which he managed to control without insulin or metformin after about a year. His blood sugar levels have remained stable, with an A1c below 7 since discontinuing insulin. He monitors his blood pressure at home, noting it is elevated but lower when resting.  He struggles with sleep, often feeling sleepy during the day but unable to fall asleep at night. He has tried melatonin and Benadryl without success and was previously prescribed sertraline for sleep, which was ineffective. He wakes frequently at night due to knee pain and has a history of snoring. He sleeps in a separate room from his wife due to snoring.  He reports frequent urination at night, which he attributes to  his diabetes. Additionally, he has noticed a growth on his right testicle for about six months, which sometimes causes pressure and swelling.  He experiences fluctuations in weight, with periods of reduced appetite followed by increased indulgence. He also reports blurry vision, particularly when reading, and has not had an eye exam in over a year. He was advised to use reading glasses.  He has a family history of heart problems, strokes, diabetes, and cancer, including lung cancer in his mother and other relatives.        Current Outpatient Medications on File Prior to Visit  Medication Sig Dispense Refill   diphenhydrAMINE (BENADRYL) 25 MG tablet Take 25 mg by mouth every 6 (six) hours as needed.     hydrochlorothiazide (HYDRODIURIL) 25 MG tablet Take 25 mg by mouth daily.     ibuprofen (ADVIL) 200 MG tablet Take 800 mg by mouth every 8 (eight) hours as needed (for pain.).     lisinopril (ZESTRIL) 20 MG tablet Take 20 mg by mouth daily.     Multiple Vitamin (MULTIVITAMIN) tablet Take 1 tablet by mouth daily.     predniSONE (DELTASONE) 10 MG tablet Take 10 mg by mouth daily as needed (hives).     No current facility-administered medications on file prior to visit.    #HM Will review HM records and updated as needed.  Relevant past medical, surgical,  family and social history reviewed and updated as indicated. Interim medical history since our last visit reviewed. Allergies and medications reviewed and updated.  ROS per HPI unless specifically indicated above     Objective:    BP (!) 154/82 (BP Location: Left Arm, Cuff Size: Large)   Pulse 76   Temp 98.1 F (36.7 C) (Oral)   Ht 5' 7.8" (1.722 m)   Wt 242 lb 6.4 oz (110 kg)   SpO2 93%   BMI 37.07 kg/m   Wt Readings from Last 3 Encounters:  08/21/23 242 lb 6.4 oz (110 kg)  10/23/22 235 lb (106.6 kg)  03/28/20 225 lb (102.1 kg)     Physical Exam Constitutional:      Appearance: Normal appearance.  HENT:     Head:  Normocephalic and atraumatic.  Eyes:     Pupils: Pupils are equal, round, and reactive to light.  Cardiovascular:     Rate and Rhythm: Normal rate and regular rhythm.     Pulses: Normal pulses.     Heart sounds: Normal heart sounds.  Pulmonary:     Effort: Pulmonary effort is normal.     Breath sounds: Normal breath sounds.  Abdominal:     General: Abdomen is flat.     Palpations: Abdomen is soft.  Musculoskeletal:        General: Normal range of motion.     Cervical back: Normal range of motion.  Skin:    General: Skin is warm and dry.     Capillary Refill: Capillary refill takes less than 2 seconds.  Neurological:     General: No focal deficit present.     Mental Status: He is alert. Mental status is at baseline.  Psychiatric:        Mood and Affect: Mood normal.        Behavior: Behavior normal.         08/21/2023   10:10 AM  Depression screen PHQ 2/9  Decreased Interest 1  Down, Depressed, Hopeless 1  PHQ - 2 Score 2  Altered sleeping 3  Tired, decreased energy 3  Change in appetite 2  Feeling bad or failure about yourself  2  Trouble concentrating 2  Moving slowly or fidgety/restless 1  Suicidal thoughts 0  PHQ-9 Score 15  Difficult doing work/chores Somewhat difficult        08/21/2023   10:11 AM  GAD 7 : Generalized Anxiety Score  Nervous, Anxious, on Edge 2  Control/stop worrying 1  Worry too much - different things 1  Trouble relaxing 2  Restless 1  Easily annoyed or irritable 2  Afraid - awful might happen 1  Total GAD 7 Score 10  Anxiety Difficulty Somewhat difficult       Assessment & Plan:  Assessment & Plan   Type 2 diabetes mellitus without complication, without long-term current use of insulin (HCC) Assessment & Plan: Currently off insulin and metformin, with self-reported good sugar control. However, fatigue and fluctuating weight may suggest suboptimal control. -Check A1C today to assess current glycemic control.  Orders: -      Microalbumin, Urine Waived -     Comprehensive metabolic panel -     Ambulatory referral to Ophthalmology -     Hemoglobin A1c  Essential hypertension Assessment & Plan: Self-reported elevated blood pressure readings at home. -Continue current management and monitor blood pressure readings.   Polyarthralgia Assessment & Plan: Widespread joint pain, particularly in knees, back, elbows, and shoulder. History of  ACL and rotator cuff surgeries. Pain has been worsening over the past six months, affecting sleep and energy levels. -Order comprehensive blood work to evaluate for possible inflammatory or rheumatologic conditions.  Orders: -     Sedimentation rate -     C-reactive protein -     Rheumatoid factor -     ANA -     ANA+ENA+DNA/DS+Antich+Centr -     CK -     Rocky mtn spotted fvr abs pnl(IgG+IgM) -     Uric acid -     Ehrlichia antibody panel -     CBC with Differential/Platelet -     Spotted Fever Group Antibodies -     Comprehensive metabolic panel -     TSH -     PSA -     Anti-CCP Ab, IgG + IgA (RDL) -     Anti-CCP Ab, IgG + IgA (RDL) -     Specimen status report  Daytime somnolence Assessment & Plan: Sleep study sent.  Orders: -     Ambulatory referral to Sleep Studies  Primary insomnia Assessment & Plan: Reports difficulty falling asleep, frequent awakenings, and non-restorative sleep. This may be contributing to daytime fatigue. -Trial of Trazodone as a sleep aid. -Consider home sleep apnea test given symptoms of excessive daytime sleepiness.  Orders: -     traZODone HCl; Take 0.5-1 tablets (25-50 mg total) by mouth at bedtime as needed for sleep.  Dispense: 30 tablet; Refill: 3  Encounter to establish care Reviewed available patient record including history, medications, problem list. HM updated as able. Will review and/or request outside records (if applicable) and will fill remaining HM gaps as needed at follow up visit.   Testicular mass Noticed a  lump on right testicle for the past six months, with intermittent swelling and discomfort. -Order scrotal ultrasound to evaluate the nature of the mass. Hypertension -     US SCROTUM W/DOPPLER; Future  Encounter for behavioral health screening As part of their intake evaluation, the patient was screened for depression, anxiety.  PHQ9 SCORE 15, GAD7 SCORE 10. Screening results positive for tested conditions. Will address at future visits.   Lipid screening -     Lipid panel  Diabetes mellitus screening  Other fatigue -     TSH  Prostate cancer screening -     PSA  Cigarette nicotine dependence without complication Declines treatment. Lung ca as below. -     Ambulatory Referral for Lung Cancer Scre    Follow up plan: No follow-ups on file.  Jackolyn Confer, MD

## 2023-08-21 NOTE — Patient Instructions (Addendum)
 I got some labs to look for reasons for your joint pain and fatigue I recommend having an ultrasound done - they will call you to schedule this  Based on results, I'll call you for our next follow up.  For sleep, I sent trazadone as needed 0.5-1 full tab. Take 30 minutes before bed.  You will receive at home: - sleep test - cologuard for colon cancer screening  Good to meet you! Welcome to [facility]!  As your primary care doctor, I look forward to working with you to help you reach your health goals.  Please be aware of a couple of logistical items: - If you message me on mychart, it may take me 1-2 business days to get back to you. This is for non-urgent messaging.  - If you require urgent clinical attention, please call the clinic or present to urgent care/emergency room - If you have labs, I typically will send a message about them in 1-2 business days. - I am not here on Mondays, otherwise will be available from Tuesday-Friday during 8a-5pm.

## 2023-08-22 ENCOUNTER — Other Ambulatory Visit: Payer: Self-pay | Admitting: Pediatrics

## 2023-08-22 DIAGNOSIS — M2550 Pain in unspecified joint: Secondary | ICD-10-CM

## 2023-08-23 LAB — SPOTTED FEVER GROUP ANTIBODIES
Spotted Fever Group IgG: <1:64 {titer}
Spotted Fever Group IgM: <1:64 {titer}

## 2023-08-26 ENCOUNTER — Telehealth: Payer: Self-pay

## 2023-08-26 NOTE — Telephone Encounter (Signed)
 Routing to provider to advise. Did not see a result note with the labs.   Copied from CRM [id number]. Topic: Clinical - Lab/Test Results >> Aug 26, 2023  3:03 PM Perry Hansen wrote: Reason for CRM: Patient calling to follow up on lab results.  Patient requesting a call back once results are back, [phone]

## 2023-08-28 LAB — ANTI-CCP AB, IGG + IGA (RDL)

## 2023-08-28 LAB — SPECIMEN STATUS REPORT

## 2023-08-29 LAB — EHRLICHIA ANTIBODY PANEL
E. Chaffeensis (HME) IgM Titer: NEGATIVE
E.Chaffeensis (HME) IgG: NEGATIVE
HGE IgG Titer: NEGATIVE
HGE IgM Titer: NEGATIVE

## 2023-08-29 LAB — CBC WITH DIFFERENTIAL/PLATELET
Basophils Absolute: 0.1 10*3/uL (ref 0.0–0.2)
Basos: 1 %
EOS (ABSOLUTE): 0.6 10*3/uL — ABNORMAL HIGH (ref 0.0–0.4)
Eos: 5 %
Hematocrit: 49.4 % (ref 37.5–51.0)
Hemoglobin: 16 g/dL (ref 13.0–17.7)
Immature Grans (Abs): 0 10*3/uL (ref 0.0–0.1)
Immature Granulocytes: 0 %
Lymphocytes Absolute: 4.2 10*3/uL — ABNORMAL HIGH (ref 0.7–3.1)
Lymphs: 34 %
MCH: 30.4 pg (ref 26.6–33.0)
MCHC: 32.4 g/dL (ref 31.5–35.7)
MCV: 94 fL (ref 79–97)
Monocytes Absolute: 0.9 10*3/uL (ref 0.1–0.9)
Monocytes: 7 %
Neutrophils Absolute: 6.7 10*3/uL (ref 1.4–7.0)
Neutrophils: 53 %
Platelets: 334 10*3/uL (ref 150–450)
RBC: 5.27 x10E6/uL (ref 4.14–5.80)
RDW: 13.4 % (ref 11.6–15.4)
WBC: 12.6 10*3/uL — ABNORMAL HIGH (ref 3.4–10.8)

## 2023-08-29 LAB — COMPREHENSIVE METABOLIC PANEL
ALT: 23 IU/L (ref 0–44)
AST: 25 IU/L (ref 0–40)
Albumin: 4.2 g/dL (ref 3.8–4.9)
Alkaline Phosphatase: 82 IU/L (ref 44–121)
BUN/Creatinine Ratio: 19 (ref 9–20)
BUN: 17 mg/dL (ref 6–24)
Bilirubin Total: 0.4 mg/dL (ref 0.0–1.2)
CO2: 26 mmol/L (ref 20–29)
Calcium: 9.7 mg/dL (ref 8.7–10.2)
Chloride: 100 mmol/L (ref 96–106)
Creatinine, Ser: 0.89 mg/dL (ref 0.76–1.27)
Globulin, Total: 2.1 g/dL (ref 1.5–4.5)
Glucose: 88 mg/dL (ref 70–99)
Potassium: 3.6 mmol/L (ref 3.5–5.2)
Sodium: 139 mmol/L (ref 134–144)
Total Protein: 6.3 g/dL (ref 6.0–8.5)
eGFR: 102 mL/min/{1.73_m2} (ref >59)

## 2023-08-29 LAB — RHEUMATOID FACTOR: Rheumatoid fact SerPl-aCnc: 80.2 [IU]/mL — ABNORMAL HIGH (ref <14.0)

## 2023-08-29 LAB — HEMOGLOBIN A1C
Est. average glucose Bld gHb Est-mCnc: 134 mg/dL
Hgb A1c MFr Bld: 6.3 % — ABNORMAL HIGH (ref 4.8–5.6)

## 2023-08-29 LAB — LIPID PANEL
Chol/HDL Ratio: 4.1 ratio (ref 0.0–5.0)
Cholesterol, Total: 181 mg/dL (ref 100–199)
HDL: 44 mg/dL (ref >39)
LDL Chol Calc (NIH): 112 mg/dL — ABNORMAL HIGH (ref 0–99)
Triglycerides: 138 mg/dL (ref 0–149)
VLDL Cholesterol Cal: 25 mg/dL (ref 5–40)

## 2023-08-29 LAB — PSA: Prostate Specific Ag, Serum: 1.1 ng/mL (ref 0.0–4.0)

## 2023-08-29 LAB — TSH: TSH: 1.49 u[IU]/mL (ref 0.450–4.500)

## 2023-08-29 LAB — ANA+ENA+DNA/DS+ANTICH+CENTR
ANA Titer 1: NEGATIVE
Anti JO-1: <0.2 AI (ref 0.0–0.9)
Centromere Ab Screen: <0.2 AI (ref 0.0–0.9)
Chromatin Ab SerPl-aCnc: <0.2 AI (ref 0.0–0.9)
ENA RNP Ab: 0.6 AI (ref 0.0–0.9)
ENA SM Ab Ser-aCnc: <0.2 AI (ref 0.0–0.9)
ENA SSA (RO) Ab: <0.2 AI (ref 0.0–0.9)
ENA SSB (LA) Ab: <0.2 AI (ref 0.0–0.9)
Scleroderma (Scl-70) (ENA) Antibody, IgG: <0.2 AI (ref 0.0–0.9)
dsDNA Ab: <1 [IU]/mL (ref 0–9)

## 2023-08-29 LAB — SEDIMENTATION RATE: Sed Rate: 18 mm/h (ref 0–30)

## 2023-08-29 LAB — CK: Total CK: 114 U/L (ref 41–331)

## 2023-08-29 LAB — C-REACTIVE PROTEIN: CRP: 9 mg/L (ref 0–10)

## 2023-08-29 LAB — ANA: Anti Nuclear Antibody (ANA): NEGATIVE

## 2023-08-29 LAB — URIC ACID: Uric Acid: 8 mg/dL (ref 3.8–8.4)

## 2023-08-29 NOTE — Assessment & Plan Note (Signed)
 Reports difficulty falling asleep, frequent awakenings, and non-restorative sleep. This may be contributing to daytime fatigue. -Trial of Trazodone as a sleep aid. -Consider home sleep apnea test given symptoms of excessive daytime sleepiness.

## 2023-08-29 NOTE — Assessment & Plan Note (Signed)
 Currently off insulin and metformin, with self-reported good sugar control. However, fatigue and fluctuating weight may suggest suboptimal control. -Check A1C today to assess current glycemic control.

## 2023-08-31 ENCOUNTER — Encounter: Payer: Self-pay | Admitting: Pediatrics

## 2023-08-31 DIAGNOSIS — M2550 Pain in unspecified joint: Secondary | ICD-10-CM | POA: Insufficient documentation

## 2023-08-31 DIAGNOSIS — R400 Somnolence: Secondary | ICD-10-CM | POA: Insufficient documentation

## 2023-08-31 NOTE — Assessment & Plan Note (Signed)
Sleep study sent

## 2023-08-31 NOTE — Assessment & Plan Note (Signed)
 Self-reported elevated blood pressure readings at home. -Continue current management and monitor blood pressure readings.

## 2023-08-31 NOTE — Assessment & Plan Note (Signed)
 Widespread joint pain, particularly in knees, back, elbows, and shoulder. History of ACL and rotator cuff surgeries. Pain has been worsening over the past six months, affecting sleep and energy levels. -Order comprehensive blood work to evaluate for possible inflammatory or rheumatologic conditions.

## 2023-09-01 NOTE — Progress Notes (Signed)
 Appointment has been schedule and reminder has been mailed to patient.

## 2023-09-03 ENCOUNTER — Ambulatory Visit

## 2023-09-09 ENCOUNTER — Ambulatory Visit: Admitting: Pediatrics

## 2023-09-09 ENCOUNTER — Encounter: Payer: Self-pay | Admitting: Pediatrics

## 2023-09-09 VITALS — BP 151/85 | HR 75 | Temp 97.9°F | Ht 68.0 in | Wt 243.0 lb

## 2023-09-09 DIAGNOSIS — M2550 Pain in unspecified joint: Secondary | ICD-10-CM

## 2023-09-09 DIAGNOSIS — R400 Somnolence: Secondary | ICD-10-CM

## 2023-09-09 DIAGNOSIS — L509 Urticaria, unspecified: Secondary | ICD-10-CM

## 2023-09-09 DIAGNOSIS — I10 Essential (primary) hypertension: Secondary | ICD-10-CM

## 2023-09-09 DIAGNOSIS — Z1330 Encounter for screening examination for mental health and behavioral disorders, unspecified: Secondary | ICD-10-CM

## 2023-09-09 DIAGNOSIS — R768 Other specified abnormal immunological findings in serum: Secondary | ICD-10-CM

## 2023-09-09 MED ORDER — OLMESARTAN MEDOXOMIL 20 MG PO TABS
20.0000 mg | ORAL_TABLET | Freq: Every day | ORAL | 2 refills | Status: DC
Start: 2023-09-09 — End: 2023-12-24

## 2023-09-09 NOTE — Assessment & Plan Note (Signed)
 Elevated rheumatoid factor with negative rheumatologic w/u otherwise. Given persistance of fatigue and polyarthralgia, pt opts for referral to rheum vs monitoring symptoms.  - Refer to rheumatologist for evaluation of polyarthralgia and elevated rheumatoid factor.

## 2023-09-09 NOTE — Assessment & Plan Note (Signed)
 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 Specific cause unknown. Cost constraints limit treatment options. Managed with prednisone prn for breakouts. - Prescribe prednisone 10 mg for management of hives if recurs

## 2023-09-09 NOTE — Assessment & Plan Note (Signed)
 Blood pressure remains elevated. Lisinopril may not provide adequate control. Plan to switch to olmesartan for better stability. - Discontinue lisinopril. - Initiate olmesartan. - Follow up in four weeks to assess blood pressure control.

## 2023-09-09 NOTE — Patient Instructions (Signed)
 Stop lisinopril, switch to olmesartan

## 2023-09-09 NOTE — Progress Notes (Signed)
 Office Visit  BP (!) 151/85   Pulse 75   Temp 97.9 F (36.6 C) (Oral)   Ht 5\' 8"  (1.727 m)   Wt 243 lb (110.2 kg)   SpO2 97%   BMI 36.95 kg/m    Subjective:    Patient ID: Perry Hansen, male    DOB: 20-Feb-1971, 53 y.o.   MRN: [id number]  HPI: Perry Hansen is a 53 y.o. male  Chief Complaint  Patient presents with   Diabetes    Discussed the use of AI scribe software for clinical note transcription with the patient, who gave verbal consent to proceed.  History of Present Illness   Perry Hansen is a 53 years old male with hypertension and chronic urticaria who presents with persistent fatigue and joint pain.  He experiences persistent fatigue despite having reassuring lab results, including a well-controlled A1c. He uses trazodone for sleep, initially taking half a dose and then increasing to a full dose after two weeks, which has improved his sleep quality but not alleviated his fatigue. His sleep apnea test was not covered by insurance, which has been a financial burden.  He has a history of elevated rheumatoid factor, although confirmatory tests for rheumatoid arthritis were negative. He experiences joint pain, which he suspects may be related to his fatigue. He has not yet seen a rheumatologist for this issue.  He has hypertension and is currently taking lisinopril and hydrochlorothiazide. His blood pressure remains high despite medication.  He has chronic urticaria, which he manages with 10 mg of prednisone daily to control hives. He experiences hives daily, primarily on his arms, rib area, and legs, which clear up with prednisone. He describes the hives as sometimes becoming blotchy, blistering, and hot. He has previously undergone a biopsy for these symptoms, which was diagnosed as chronic urticaria, an autoimmune disorder. He has not pursued further treatment due to the high cost of recommended injections.  He has encountered financial difficulties with medical expenses,  including a sleep apnea test and an ultrasound, which were not covered by his insurance. He is concerned about the cost of these tests and the impact on his ability to receive necessary care.        Relevant past medical, surgical, family and social history reviewed and updated as indicated. Interim medical history since our last visit reviewed. Allergies and medications reviewed and updated.  ROS per HPI unless specifically indicated above     Objective:    BP (!) 151/85   Pulse 75   Temp 97.9 F (36.6 C) (Oral)   Ht 5\' 8"  (1.727 m)   Wt 243 lb (110.2 kg)   SpO2 97%   BMI 36.95 kg/m   Wt Readings from Last 3 Encounters:  09/09/23 243 lb (110.2 kg)  08/21/23 242 lb 6.4 oz (110 kg)  10/23/22 235 lb (106.6 kg)     Physical Exam Constitutional:      Appearance: Normal appearance.  Pulmonary:     Effort: Pulmonary effort is normal.  Musculoskeletal:        General: Normal range of motion.  Skin:    Comments: Normal skin color  Neurological:     General: No focal deficit present.     Mental Status: He is alert. Mental status is at baseline.  Psychiatric:        Mood and Affect: Mood normal.        Behavior: Behavior normal.        Thought Content:  Thought content normal.         09/09/2023    8:09 AM 08/21/2023   10:10 AM  Depression screen PHQ 2/9  Decreased Interest 1 1  Down, Depressed, Hopeless 1 1  PHQ - 2 Score 2 2  Altered sleeping 2 3  Tired, decreased energy 2 3  Change in appetite 2 2  Feeling bad or failure about yourself  1 2  Trouble concentrating 1 2  Moving slowly or fidgety/restless 1 1  Suicidal thoughts 0 0  PHQ-9 Score 11 15  Difficult doing work/chores Somewhat difficult Somewhat difficult       09/09/2023    8:09 AM 08/21/2023   10:11 AM  GAD 7 : Generalized Anxiety Score  Nervous, Anxious, on Edge 2 2  Control/stop worrying 2 1  Worry too much - different things 1 1  Trouble relaxing 2 2  Restless 2 1  Easily annoyed or irritable  2 2  Afraid - awful might happen 1 1  Total GAD 7 Score 12 10  Anxiety Difficulty Somewhat difficult Somewhat difficult       Assessment & Plan:  Assessment & Plan   Essential hypertension Assessment & Plan: Blood pressure remains elevated. Lisinopril may not provide adequate control. Plan to switch to olmesartan for better stability. - Discontinue lisinopril. - Initiate olmesartan. - Follow up in four weeks to assess blood pressure control.  Orders: -     Olmesartan Medoxomil; Take 1 tablet (20 mg total) by mouth daily.  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 Specific cause unknown. Cost constraints limit treatment options. Managed with prednisone prn for breakouts. - Prescribe prednisone 10 mg for management of hives if recurs   Daytime somnolence Assessment & Plan: Reports fatigue despite trazodone use. Sleep apnea testing not pursued due to cost. Insurance coverage issues need resolution. - Investigate insurance coverage for sleep apnea testing.   Polyarthralgia Elevated rheumatoid factor Assessment & Plan: Elevated rheumatoid factor with negative rheumatologic w/u otherwise. Given persistance of fatigue and polyarthralgia, pt opts for referral to rheum vs monitoring symptoms.  - Refer to rheumatologist for evaluation of polyarthralgia and elevated rheumatoid factor.  Orders: -     Ambulatory referral to Rheumatology  Encounter for behavioral health screening As part of their intake evaluation, the patient was screened for depression, anxiety.  PHQ9 SCORE 11, GAD7 SCORE 12. Screening results positive for tested conditions. Related to fatigue, see plan under problem/diagnosis above.    Follow up plan: Return in about 4 weeks (around 10/07/2023) for HTN.  Jackolyn Confer, MD

## 2023-09-09 NOTE — Assessment & Plan Note (Signed)
 Reports fatigue despite trazodone use. Sleep apnea testing not pursued due to cost. Insurance coverage issues need resolution. - Investigate insurance coverage for sleep apnea testing.

## 2023-09-15 ENCOUNTER — Other Ambulatory Visit: Payer: Self-pay | Admitting: Pediatrics

## 2023-09-15 NOTE — Telephone Encounter (Unsigned)
 Copied from CRM [id number]. Topic: Clinical - Medication Refill >> Sep 15, 2023  5:24 PM Everette C wrote: Most Recent Primary Care Visit:  Provider: Jackolyn Confer  Department: [facility]  Visit Type: OFFICE VISIT  Date: 09/09/2023  Medication: predniSONE (DELTASONE) 10 MG tablet [[id number]]   Has the patient contacted their pharmacy? Yes (Agent: If no, request that the patient contact the pharmacy for the refill. If patient does not wish to contact the pharmacy document the reason why and proceed with request.) (Agent: If yes, when and what did the pharmacy advise?)  Is this the correct pharmacy for this prescription? Yes If no, delete pharmacy and type the correct one.  This is the patient's preferred pharmacy:  [facility] [address] [place] - [address] [place] [address] Phone: [phone] Fax: [phone]   Has the prescription been filled recently? Yes  Is the patient out of the medication? Yes  Has the patient been seen for an appointment in the last year OR does the patient have an upcoming appointment? Yes  Can we respond through MyChart? No  Agent: Please be advised that Rx refills may take up to 3 business days. We ask that you follow-up with your pharmacy.

## 2023-09-16 ENCOUNTER — Other Ambulatory Visit: Payer: Self-pay | Admitting: Pediatrics

## 2023-09-16 ENCOUNTER — Ambulatory Visit: Admitting: Pediatrics

## 2023-09-16 DIAGNOSIS — L508 Other urticaria: Secondary | ICD-10-CM

## 2023-09-16 MED ORDER — PREDNISONE 10 MG PO TABS
10.0000 mg | ORAL_TABLET | Freq: Every day | ORAL | 3 refills | Status: DC | PRN
Start: 2023-09-16 — End: 2024-01-21

## 2023-09-16 NOTE — Progress Notes (Signed)
 Chronic urticaria - sent pred  Perry Confer, MD

## 2023-09-17 NOTE — Telephone Encounter (Signed)
 Requested medication (s) are due for refill today: No  Requested medication (s) are on the active medication list: Yes  Last refill:  09/16/23  Future visit scheduled: Yes  Notes to clinic:  Not delegated.    Requested Prescriptions  Pending Prescriptions Disp Refills   predniSONE (DELTASONE) 10 MG tablet      Sig: Take 1 tablet (10 mg total) by mouth daily as needed (hives).     Not Delegated - Endocrinology:  Oral Corticosteroids Failed - 09/17/2023  3:33 PM      Failed - This refill cannot be delegated      Failed - Manual Review: Eye exam for IOP if prolonged treatment      Failed - Last BP in normal range    BP Readings from Last 1 Encounters:  09/09/23 (!) 151/85         Failed - Bone Mineral Density or Dexa Scan completed in the last 2 years      Passed - Glucose (serum) in normal range and within 180 days    Glucose  Date Value Ref Range Status  08/21/2023 88 70 - 99 mg/dL Final  63/87/5643 329 (H) 65 - 99 mg/dL Final   Glucose, Bld  Date Value Ref Range Status  03/28/2020 100 (H) 70 - 99 mg/dL Final    Comment:    Glucose reference range applies only to samples taken after fasting for at least 8 hours.   Glucose-Capillary  Date Value Ref Range Status  03/28/2020 130 (H) 70 - 99 mg/dL Final    Comment:    Glucose reference range applies only to samples taken after fasting for at least 8 hours.         Passed - K in normal range and within 180 days    Potassium  Date Value Ref Range Status  08/21/2023 3.6 3.5 - 5.2 mmol/L Final  01/18/2012 3.4 (L) 3.5 - 5.1 mmol/L Final         Passed - Na in normal range and within 180 days    Sodium  Date Value Ref Range Status  08/21/2023 139 134 - 144 mmol/L Final  01/18/2012 139 136 - 145 mmol/L Final         Passed - Valid encounter within last 6 months    Recent Outpatient Visits           1 week ago Essential hypertension   [place] [facility] Jackolyn Confer, MD   3 weeks ago Type 2  diabetes mellitus without complication, without long-term current use of insulin [facility])   [place] [facility] Jackolyn Confer, MD

## 2023-10-14 ENCOUNTER — Ambulatory Visit: Admitting: Pediatrics

## 2023-10-14 ENCOUNTER — Encounter: Payer: Self-pay | Admitting: Pediatrics

## 2023-10-14 VITALS — BP 134/80 | HR 76 | Temp 97.8°F | Wt 240.2 lb

## 2023-10-14 DIAGNOSIS — I10 Essential (primary) hypertension: Secondary | ICD-10-CM | POA: Diagnosis not present

## 2023-10-14 DIAGNOSIS — M545 Low back pain, unspecified: Secondary | ICD-10-CM | POA: Diagnosis not present

## 2023-10-14 MED ORDER — CELECOXIB 100 MG PO CAPS
100.0000 mg | ORAL_CAPSULE | Freq: Two times a day (BID) | ORAL | 0 refills | Status: AC
Start: 2023-10-14 — End: 2023-10-28

## 2023-10-14 NOTE — Patient Instructions (Addendum)
 Continue the blood pressure medication  Celebrex for your back   Pick up over the counter voltaren gel

## 2023-10-14 NOTE — Progress Notes (Signed)
 Office Visit  BP 134/80   Pulse 76   Temp 97.8 F (36.6 C) (Oral)   Wt 240 lb 3.2 oz (109 kg)   SpO2 96%   BMI 36.52 kg/m    Subjective:    Patient ID: Perry Hansen, male    DOB: 23-Feb-1971, 53 y.o.   MRN: [id number]  HPI: Perry Hansen is a 53 y.o. male  Chief Complaint  Patient presents with   Hypertension   Back Pain    Discussed the use of AI scribe software for clinical note transcription with the patient, who gave verbal consent to proceed.  History of Present Illness   Perry Hansen is a 53 year old male who presents with lower back pain.  He has been experiencing lower back pain primarily in the middle of his back, occasionally radiating to the sides, for the past year. There is no radiation down his legs. He has not taken any medication for it except for Tylenol , as he is cautious about medication use due to his job, which involves a lot of driving. He is allergic to certain anti-inflammatory medications, which limits his treatment options. He has not used any topical treatments like Voltaren gel before.  He discusses issues with insurance coverage for a sleep study and a testicular ultrasound, both of which were quoted at nearly three hundred dollars, which he finds unaffordable. He has not received any follow-up communication regarding these tests.  His blood pressure has been well-controlled, and he has a prescription ready for refill. He has been feeling fine on his current blood pressure medications.  He has twelve grandchildren and expresses financial concerns related to medical costs. He reports sleeping better and has not needed to take trazodone  regularly.        Relevant past medical, surgical, family and social history reviewed and updated as indicated. Interim medical history since our last visit reviewed. Allergies and medications reviewed and updated.  ROS per HPI unless specifically indicated above     Objective:    BP 134/80   Pulse 76   Temp  97.8 F (36.6 C) (Oral)   Wt 240 lb 3.2 oz (109 kg)   SpO2 96%   BMI 36.52 kg/m   Wt Readings from Last 3 Encounters:  10/14/23 240 lb 3.2 oz (109 kg)  09/09/23 243 lb (110.2 kg)  08/21/23 242 lb 6.4 oz (110 kg)     Physical Exam Constitutional:      Appearance: Normal appearance.  Pulmonary:     Effort: Pulmonary effort is normal.  Musculoskeletal:        General: Normal range of motion.     Thoracic back: No lacerations. Normal range of motion.     Lumbar back: No tenderness or bony tenderness. Negative right straight leg raise test and negative left straight leg raise test.       Back:     Comments: Tenderness in area above  Skin:    Comments: Normal skin color  Neurological:     General: No focal deficit present.     Mental Status: He is alert. Mental status is at baseline.  Psychiatric:        Mood and Affect: Mood normal.        Behavior: Behavior normal.        Thought Content: Thought content normal.         10/14/2023    8:22 AM 09/09/2023    8:09 AM 08/21/2023   10:10  AM  Depression screen PHQ 2/9  Decreased Interest 0 1 1  Down, Depressed, Hopeless 0 1 1  PHQ - 2 Score 0 2 2  Altered sleeping 0 2 3  Tired, decreased energy 0 2 3  Change in appetite 0 2 2  Feeling bad or failure about yourself  0 1 2  Trouble concentrating 0 1 2  Moving slowly or fidgety/restless 0 1 1  Suicidal thoughts 0 0 0  PHQ-9 Score 0 11 15  Difficult doing work/chores Not difficult at all Somewhat difficult Somewhat difficult       10/14/2023    8:22 AM 09/09/2023    8:09 AM 08/21/2023   10:11 AM  GAD 7 : Generalized Anxiety Score  Nervous, Anxious, on Edge 0 2 2  Control/stop worrying 0 2 1  Worry too much - different things 0 1 1  Trouble relaxing 0 2 2  Restless 0 2 1  Easily annoyed or irritable 0 2 2  Afraid - awful might happen 0 1 1  Total GAD 7 Score 0 12 10  Anxiety Difficulty Not difficult at all Somewhat difficult Somewhat difficult       Assessment &  Plan:  Assessment & Plan   Lumbar back pain Chronic low back pain without leg radiation. Celebrex suggested due to Meloxicam allergy. Voltaren gel discussed for topical relief. Celebrex cost approximately four dollars with insurance; GoodRx card available. - Prescribe Celebrex twice daily. - Recommend Voltaren gel for nighttime use. - Advise continued use of Tylenol  and ibuprofen as needed. - Instruct to contact office if Celebrex is too expensive or if pain persists.  -     Celecoxib; Take 1 capsule (100 mg total) by mouth 2 (two) times daily for 14 days.  Dispense: 28 capsule; Refill: 0  Essential hypertension Offered increasing for tighter BP control, prefers to stay at current dose. Plan to consider combo pill though I suspect will not have insurance coverage.   Pending due to insurance coverage issues: - sleep test - scrotal ultrasound  Follow up plan: Return in about 3 months (around 01/13/2024) for HTN, DM.  Hadassah Letters, MD

## 2023-10-23 ENCOUNTER — Telehealth: Payer: Self-pay

## 2023-10-23 ENCOUNTER — Telehealth: Payer: Self-pay | Admitting: Pediatrics

## 2023-10-23 NOTE — Telephone Encounter (Signed)
 Copied from CRM [id number]. Topic: General - Other >> Oct 23, 2023 12:18 PM Emylou G wrote: Reason for CRM: Patient called was on celecoxib  (CELEBREX ) 100 MG capsule.. had to stop taking it was making him coughing up blood.. but it stopped and no more symptoms.. Can you up the prednisone  or give alternative.  Pls call him number on file good

## 2023-10-23 NOTE — Telephone Encounter (Signed)
 Copied from CRM [id number]. Topic: General - Other >> Oct 23, 2023 12:20 PM Emylou G wrote: Reason for CRM: Patient called.Aaron Aas adv was referred to [facility] clinic.. they did call but they don't accept his insurance.. pls advise.. this was for his back pain.

## 2023-10-23 NOTE — Telephone Encounter (Signed)
 Forwarding to provider CMA for assistance.

## 2023-10-24 NOTE — Telephone Encounter (Signed)
 Called patient he states that everything he is getting referred to doesn't accept his insurance and is getting frustrated due to the issue

## 2023-11-19 ENCOUNTER — Telehealth: Payer: Self-pay

## 2023-11-19 NOTE — Telephone Encounter (Signed)
 Please advise?    Copied from CRM [id number]. Topic: Clinical - Medication Question >> Nov 19, 2023 11:37 AM Perry Hansen wrote: Reason for CRM: Patient is calling in because he says his provider had him on Celebrex  but he stopped taking it because he was having reactions from it. Patient says his back is hurting him and tylenol  and ibuprofen aren't working. Patient wants to know can his provider send him in another medication. He says he was referred to a doctor for osteoarthritis but he can't be seen until October/November. Please follow up with patient.

## 2023-11-21 ENCOUNTER — Other Ambulatory Visit: Payer: Self-pay | Admitting: Pediatrics

## 2023-11-21 DIAGNOSIS — M545 Low back pain, unspecified: Secondary | ICD-10-CM

## 2023-11-21 MED ORDER — TRAMADOL HCL 50 MG PO TABS
50.0000 mg | ORAL_TABLET | Freq: Three times a day (TID) | ORAL | 0 refills | Status: AC | PRN
Start: 2023-11-21 — End: 2023-11-26

## 2023-11-21 NOTE — Progress Notes (Signed)
 Tramadol sent for back pain. Celebrex  not working, allergic to NSAIDs?   Needs visit for more refills and eval  Hadassah Letters, MD

## 2023-12-22 ENCOUNTER — Other Ambulatory Visit: Payer: Self-pay | Admitting: Pediatrics

## 2023-12-22 DIAGNOSIS — I10 Essential (primary) hypertension: Secondary | ICD-10-CM

## 2023-12-22 NOTE — Telephone Encounter (Signed)
 Copied from CRM [id number]. Topic: Clinical - Medication Refill >> Dec 22, 2023  2:49 PM Edsel HERO wrote: Medication:  olmesartan  (BENICAR ) 20 MG tablet traMADol  (ULTRAM ) 50 MG tablet  Has the patient contacted their pharmacy? No (Agent: If no, request that the patient contact the pharmacy for the refill. If patient does not wish to contact the pharmacy document the reason why and proceed with request.) (Agent: If yes, when and what did the pharmacy advise?)  This is the patient's preferred pharmacy:  [facility] [address], [place] - [address] [place] [address] Phone: [phone] Fax: [phone]  Is this the correct pharmacy for this prescription? Yes If no, delete pharmacy and type the correct one.   Has the prescription been filled recently? Yes  Is the patient out of the medication? Yes  Has the patient been seen for an appointment in the last year OR does the patient have an upcoming appointment? Yes  Can we respond through MyChart? Yes  Agent: Please be advised that Rx refills may take up to 3 business days. We ask that you follow-up with your pharmacy.   ----------------------------------------------------------------------- From previous Reason for Contact - Appt Info/Confirm: Patient/patient representative is calling for information regarding an appointment.

## 2023-12-22 NOTE — Telephone Encounter (Signed)
Tramadol not on current med list

## 2023-12-23 ENCOUNTER — Other Ambulatory Visit: Payer: Self-pay | Admitting: Pediatrics

## 2023-12-23 DIAGNOSIS — I10 Essential (primary) hypertension: Secondary | ICD-10-CM

## 2023-12-24 ENCOUNTER — Other Ambulatory Visit: Payer: Self-pay | Admitting: Pediatrics

## 2023-12-24 DIAGNOSIS — I10 Essential (primary) hypertension: Secondary | ICD-10-CM

## 2023-12-24 MED ORDER — OLMESARTAN MEDOXOMIL 20 MG PO TABS: 20.0000 mg | ORAL_TABLET | Freq: Every day | ORAL | 2 refills | Status: DC

## 2023-12-24 NOTE — Progress Notes (Signed)
 Received fax about refill request.   Perry SHAUNNA Nett, MD

## 2024-01-09 ENCOUNTER — Telehealth: Payer: Self-pay

## 2024-01-09 NOTE — Telephone Encounter (Signed)
 Faxed received from pharmacy requesting refills on hydroclorotiazide 25mg  tabs previously prescribed by Dr.McDonald.   Please advise

## 2024-01-13 ENCOUNTER — Other Ambulatory Visit: Payer: Self-pay | Admitting: Pediatrics

## 2024-01-13 DIAGNOSIS — I10 Essential (primary) hypertension: Secondary | ICD-10-CM

## 2024-01-13 MED ORDER — HYDROCHLOROTHIAZIDE 25 MG PO TABS
25.0000 mg | ORAL_TABLET | Freq: Every day | ORAL | 3 refills | Status: AC
Start: 2024-01-13 — End: ?

## 2024-01-13 NOTE — Progress Notes (Signed)
 Requesting hydrochlorothiazide  refills previously prescribed by PCP  Hadassah SHAUNNA Nett, MD

## 2024-01-21 ENCOUNTER — Encounter: Payer: Self-pay | Admitting: Pediatrics

## 2024-01-21 ENCOUNTER — Ambulatory Visit: Admitting: Pediatrics

## 2024-01-21 VITALS — BP 138/89 | HR 76 | Temp 97.5°F | Wt 236.0 lb

## 2024-01-21 DIAGNOSIS — M545 Low back pain, unspecified: Secondary | ICD-10-CM | POA: Diagnosis not present

## 2024-01-21 DIAGNOSIS — H9191 Unspecified hearing loss, right ear: Secondary | ICD-10-CM

## 2024-01-21 DIAGNOSIS — I10 Essential (primary) hypertension: Secondary | ICD-10-CM

## 2024-01-21 DIAGNOSIS — L508 Other urticaria: Secondary | ICD-10-CM | POA: Diagnosis not present

## 2024-01-21 DIAGNOSIS — G8929 Other chronic pain: Secondary | ICD-10-CM

## 2024-01-21 MED ORDER — TRAMADOL HCL 50 MG PO TABS: 50.0000 mg | ORAL_TABLET | Freq: Three times a day (TID) | ORAL | 0 refills | Status: AC | PRN

## 2024-01-21 MED ORDER — PREDNISONE 10 MG PO TABS: 10.0000 mg | ORAL_TABLET | Freq: Every day | ORAL | 3 refills | Status: DC | PRN

## 2024-01-21 MED ORDER — GABAPENTIN 300 MG PO CAPS
300.0000 mg | ORAL_CAPSULE | Freq: Every day | ORAL | 3 refills | Status: AC
Start: 2024-01-21 — End: ?

## 2024-01-21 NOTE — Assessment & Plan Note (Signed)
 Chronic mechanical back pain likely due to occupational activities. Awaiting orthopedic evaluation. Imaging and targeted injections discussed as alternatives to surgery. Prescribe gabapentin  300 mg once daily at night and tramadol  for breakthrough pain. Order x-ray of the lower back, running a test claim with insurance first. Continue ibuprofen and acetaminophen  as needed, adhering to maximum daily dosages. Coordinate with orthopedic specialist for potential imaging and targeted injections. Schedule follow-up in 4-6 weeks to assess pain management efficacy.

## 2024-01-21 NOTE — Progress Notes (Signed)
 Office Visit  BP 138/89   Pulse 76   Temp (!) 97.5 F (36.4 C) (Oral)   Wt 236 lb (107 kg)   SpO2 96%   BMI 35.88 kg/m    Subjective:    Patient ID: Norleen ONEIDA Ernst, male    DOB: 07-13-1970, 53 y.o.   MRN: [id number]  HPI: CREW GOREN is a 53 y.o. male  Chief Complaint  Patient presents with   Back Pain    Discussed the use of AI scribe software for clinical note transcription with the patient, who gave verbal consent to proceed.  History of Present Illness   KATLIN CISZEWSKI is a 53 year old male with osteoarthritis who presents with lower back pain.  He experiences persistent lower back pain, primarily on the right side, without radiation down the leg. 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 He frequently uses Tylenol  and ibuprofen for pain management, stating he takes them 'like it's candy'.  He previously found significant relief with tramadol  but has run out of it. Gabapentin  was well tolerated in the past, while Lyrica was ineffective. He is currently taking hydrochlorothiazide  and olmesartan  for hypertension, which is well controlled, and uses trazodone  for sleep, which he finds effective.  He experiences exacerbation of hives with hot weather and clothing friction, for which he uses prednisone . He mentions a recent increase in hives severity due to the weather.  He has a history of shoulder and knee surgeries, during which he was prescribed gabapentin  for nerve pain. He reports a high tolerance for pain and does not experience pain radiating down his leg, which he initially thought might be related to a sciatic nerve issue.  He recently took a week off work for his anniversary, during which he camped with his grandchildren, but he continued to experience significant back pain despite the rest. He mentions difficulty hearing out of one ear for about a month, without  associated pain or discharge.      Relevant past medical, surgical, family and social history reviewed and updated as indicated. Interim medical history since our last visit reviewed. Allergies and medications reviewed and updated.  ROS per HPI unless specifically indicated above     Objective:    BP 138/89   Pulse 76   Temp (!) 97.5 F (36.4 C) (Oral)   Wt 236 lb (107 kg)   SpO2 96%   BMI 35.88 kg/m   Wt Readings from Last 3 Encounters:  01/21/24 236 lb (107 kg)  10/14/23 240 lb 3.2 oz (109 kg)  09/09/23 243 lb (110.2 kg)     Physical Exam Constitutional:      Appearance: Normal appearance.  Pulmonary:     Effort: Pulmonary effort is normal.  Musculoskeletal:        General: Normal range of motion.  Skin:    Comments: Normal skin color  Neurological:     General: No focal deficit present.     Mental Status: He is alert. Mental status is at baseline.  Psychiatric:        Mood and Affect: Mood normal.        Behavior: Behavior normal.        Thought Content: Thought content normal.         10/14/2023    8:22 AM 09/09/2023    8:09 AM 08/21/2023   10:10 AM  Depression screen PHQ 2/9  Decreased Interest 0 1 1  Down, Depressed, Hopeless 0 1 1  PHQ - 2 Score 0 2 2  Altered sleeping 0 2 3  Tired, decreased energy 0 2 3  Change in appetite 0 2 2  Feeling bad or failure about yourself  0 1 2  Trouble concentrating 0 1 2  Moving slowly or fidgety/restless 0 1 1  Suicidal thoughts 0 0 0  PHQ-9 Score 0 11 15  Difficult doing work/chores Not difficult at all Somewhat difficult Somewhat difficult       10/14/2023    8:22 AM 09/09/2023    8:09 AM 08/21/2023   10:11 AM  GAD 7 : Generalized Anxiety Score  Nervous, Anxious, on Edge 0 2 2  Control/stop worrying 0 2 1  Worry too much - different things 0 1 1  Trouble relaxing 0 2 2  Restless 0 2 1  Easily annoyed or irritable 0 2 2  Afraid - awful might happen 0 1 1  Total GAD 7 Score 0 12 10  Anxiety Difficulty Not  difficult at all Somewhat difficult Somewhat difficult       Assessment & Plan:  Assessment & Plan   Essential hypertension Assessment & Plan: Hypertension is well-controlled with olmesartan  and hydrochlorothiazide . Blood pressure is borderline, possibly influenced by pain. Continue olmesartan  and hydrochlorothiazide . Monitor blood pressure and consider increasing olmesartan  if hypertension persists after pain management.   Chronic urticaria Assessment & Plan: Chronic urticaria is exacerbated by hot weather and friction. Prednisone  is effective in managing symptoms. Prescribe prednisone  and ensure adequate refills are available.  Orders: -     predniSONE ; Take 1 tablet (10 mg total) by mouth daily as needed (hives).  Dispense: 30 tablet; Refill: 3  Chronic right-sided low back pain without sciatica Assessment & Plan: Chronic mechanical back pain likely due to occupational activities. Awaiting orthopedic evaluation. Imaging and targeted injections discussed as alternatives to surgery. Prescribe gabapentin  300 mg once daily at night and tramadol  for breakthrough pain. Order x-ray of the lower back, running a test claim with insurance first. Continue ibuprofen and acetaminophen  as needed, adhering to maximum daily dosages. Coordinate with orthopedic specialist for potential imaging and targeted injections. Schedule follow-up in 4-6 weeks to assess pain management efficacy.  Orders: -     Gabapentin ; Take 1 capsule (300 mg total) by mouth at bedtime.  Dispense: 30 capsule; Refill: 3 -     traMADol  HCl; Take 1 tablet (50 mg total) by mouth every 8 (eight) hours as needed for up to 5 days.  Dispense: 15 tablet; Refill: 0 -     DG Lumbar Spine Complete; Future  Change in hearing, right Right ear hearing loss for one month with no pain or discharge. No wax buildup or structural abnormalities. Audiology exam is recommended. Coordinate with ENT clinic for audiology exam and verify insurance  coverage before referral.  -     Ambulatory referral to Audiology   Follow up plan: Return in about 6 weeks (around 03/03/2024) for chronic pain.  Hadassah SHAUNNA Nett, MD

## 2024-01-21 NOTE — Patient Instructions (Signed)
 Tylenol : 3000mg  max Ibuprofen max: under 1200mg 

## 2024-01-21 NOTE — Assessment & Plan Note (Signed)
 Hypertension is well-controlled with olmesartan  and hydrochlorothiazide . Blood pressure is borderline, possibly influenced by pain. Continue olmesartan  and hydrochlorothiazide . Monitor blood pressure and consider increasing olmesartan  if hypertension persists after pain management.

## 2024-01-21 NOTE — Assessment & Plan Note (Signed)
 Chronic urticaria is exacerbated by hot weather and friction. Prednisone  is effective in managing symptoms. Prescribe prednisone  and ensure adequate refills are available.

## 2024-02-03 ENCOUNTER — Ambulatory Visit: Admitting: Audiologist

## 2024-02-20 ENCOUNTER — Ambulatory Visit: Admitting: Pediatrics

## 2024-03-02 ENCOUNTER — Ambulatory Visit: Admitting: Pediatrics

## 2024-03-16 ENCOUNTER — Encounter: Payer: Self-pay | Admitting: Pediatrics

## 2024-03-16 ENCOUNTER — Ambulatory Visit: Payer: Self-pay

## 2024-03-16 ENCOUNTER — Encounter: Payer: Self-pay | Admitting: Nurse Practitioner

## 2024-03-16 ENCOUNTER — Ambulatory Visit (INDEPENDENT_AMBULATORY_CARE_PROVIDER_SITE_OTHER): Admitting: Nurse Practitioner

## 2024-03-16 VITALS — BP 137/85 | HR 75 | Ht 68.0 in | Wt 238.6 lb

## 2024-03-16 DIAGNOSIS — J019 Acute sinusitis, unspecified: Secondary | ICD-10-CM

## 2024-03-16 LAB — POC COVID19/FLU A&B COMBO
Covid Antigen, POC: NEGATIVE
Influenza A Antigen, POC: NEGATIVE
Influenza B Antigen, POC: NEGATIVE

## 2024-03-16 MED ORDER — AMOXICILLIN 500 MG PO CAPS: 500.0000 mg | ORAL_CAPSULE | Freq: Two times a day (BID) | ORAL | 0 refills | Status: AC

## 2024-03-16 NOTE — Addendum Note (Signed)
 Addended by: Zyrell Carmean M on: 03/16/2024 04:41 PM   Modules accepted: Orders

## 2024-03-16 NOTE — Telephone Encounter (Signed)
 FYI Only or Action Required?: FYI only for provider.  Patient was last seen in primary care on 01/21/2024 by Perry Hadassah SQUIBB, MD.  Called Nurse Triage reporting Facial Pain.  Symptoms began a week ago.  Interventions attempted: Rest, hydration, or home remedies.  Symptoms are: gradually worsening.  Triage Disposition: See Physician Within 24 Hours  Patient/caregiver understands and will follow disposition?: Yes, will follow disposition  Copied from CRM #[id number]. Topic: Clinical - Red Word Triage >> Mar 16, 2024  8:51 AM Avram MATSU wrote: Red Word that prompted transfer to Nurse Triage: sinus infection,facial pain, head congestion,eye pressure, sneezing greenish snot. Reason for Disposition  [1] Sinus pain (not just congestion) AND [2] fever  Answer Assessment - Initial Assessment Questions 1. LOCATION: Where does it hurt?      Facial pain,  2. ONSET: When did the sinus pain start?  (e.g., hours, days)      About a week 3. SEVERITY: How bad is the pain?   (Scale 0-10; or none, mild, moderate or severe)     3 4. RECURRENT SYMPTOM: Have you ever had sinus problems before? If Yes, ask: When was the last time? and What happened that time?      States that he has a hx of sinus infection 5. NASAL CONGESTION: Is the nose blocked? If Yes, ask: Can you open it or must you breathe through your mouth?     + head congestion-green in color 6. NASAL DISCHARGE: Do you have discharge from your nose? If so ask, What color?     green 7. FEVER: Do you have a fever? If Yes, ask: What is it, how was it measured, and when did it start?      denies 8. OTHER SYMPTOMS: Do you have any other symptoms? (e.g., sore throat, cough, earache, difficulty breathing)     Eye pressure, sneezing, head congestion,  Protocols used: Sinus Pain or Congestion-A-AH

## 2024-03-16 NOTE — Progress Notes (Signed)
 BP 137/85 (BP Location: Left Arm, Patient Position: Sitting, Cuff Size: Large)   Pulse 75   Ht 5' 8 (1.727 m)   Wt 108.2 kg   SpO2 95%   BMI 36.28 kg/m    Subjective:    Patient ID: Perry Hansen, male    DOB: [birth year], 53 y.o.   MRN: [id number]  NOTE WRITTEN BY DNP STUDENT.  ASSESSMENT AND PLAN OF CARE REVIEWED WITH STUDENT, AGREE WITH ABOVE FINDINGS AND PLAN.   Chief Complaint  Patient presents with  . Sinusitis    Snot smells like its rotten, head congestion, has been 1.5 weeks, uses Vicks inhalers and flonase as OTC treatment, woke up with eyes shut, feels drained    HPI: Perry Hansen is a 53 y.o. male who has had nasal congestion for about a week and a half. Pt denies cough, SOB, chest congestion, fever, or chills. Pt reports sinus pain, pressure and congestion. Pt reports eye crusting this morning, ear pain and pressure. Pt has been using flonase without relief.   UPPER RESPIRATORY TRACT INFECTION Worst symptom: Fever: no Cough: no Shortness of breath: no Wheezing: no Chest pain: no Chest tightness: no Chest congestion: no Nasal congestion: yes Runny nose: yes Post nasal drip: yes Sneezing: no Sore throat: no Swollen glands: no Sinus pressure: yes Headache: yes Face pain: yes Toothache: no Ear pain: yes bilateral Ear pressure: yes bilateral Eyes red/itching:yes Eye drainage/crusting: yes  Vomiting: no Rash: no Fatigue: yes Sick contacts: no Strep contacts: no  Context: worse Recurrent sinusitis: no Relief with OTC cold/cough medications: yes  Treatments attempted: cold/sinus, mucinex, and anti-histamineb   Relevant past medical, surgical, family and social history reviewed and updated as indicated. Interim medical history since our last visit reviewed. Allergies and medications reviewed and updated.  Review of Systems  Constitutional:  Positive for fatigue. Negative for chills and fever.  HENT:  Positive for ear pain, postnasal drip, rhinorrhea,  sinus pressure and sinus pain. Negative for ear discharge, hearing loss, sneezing and sore throat.   Eyes:  Positive for discharge. Negative for pain, redness and visual disturbance.  Respiratory:  Negative for cough, chest tightness, shortness of breath, wheezing and stridor.   Cardiovascular:  Negative for chest pain and palpitations.  Gastrointestinal:  Negative for abdominal pain, diarrhea and nausea.  Musculoskeletal:  Negative for arthralgias and myalgias.  Neurological:  Positive for headaches. Negative for weakness and light-headedness.    Per HPI unless specifically indicated above     Objective:    BP 137/85 (BP Location: Left Arm, Patient Position: Sitting, Cuff Size: Large)   Pulse 75   Ht 5' 8 (1.727 m)   Wt 108.2 kg   SpO2 95%   BMI 36.28 kg/m   Wt Readings from Last 3 Encounters:  03/16/24 108.2 kg  01/21/24 107 kg  10/14/23 109 kg    Physical Exam Vitals and nursing note reviewed.  Constitutional:      General: He is not in acute distress.    Appearance: He is not ill-appearing, toxic-appearing or diaphoretic.  HENT:     Head: Normocephalic.     Right Ear: External ear normal. No drainage. A middle ear effusion is present. Tympanic membrane is not erythematous or bulging.     Left Ear: External ear normal. No drainage. A middle ear effusion is present. Tympanic membrane is not erythematous or bulging.     Nose: Congestion and rhinorrhea present.     Right Sinus: Maxillary sinus tenderness  and frontal sinus tenderness present.     Left Sinus: Maxillary sinus tenderness and frontal sinus tenderness present.     Mouth/Throat:     Mouth: Mucous membranes are moist.     Pharynx: Posterior oropharyngeal erythema present. No oropharyngeal exudate.  Eyes:     General:        Right eye: No discharge.        Left eye: No discharge.     Extraocular Movements: Extraocular movements intact.     Conjunctiva/sclera: Conjunctivae normal.     Pupils: Pupils are equal,  round, and reactive to light.  Cardiovascular:     Rate and Rhythm: Normal rate and regular rhythm.     Pulses: Normal pulses.     Heart sounds: Normal heart sounds. No murmur heard. Pulmonary:     Effort: Pulmonary effort is normal. No respiratory distress.     Breath sounds: Normal breath sounds. No wheezing, rhonchi or rales.  Abdominal:     General: Abdomen is flat. Bowel sounds are normal.     Palpations: Abdomen is soft.  Musculoskeletal:     Cervical back: Normal range of motion and neck supple.  Lymphadenopathy:     Cervical: No cervical adenopathy.  Skin:    General: Skin is warm and dry.     Capillary Refill: Capillary refill takes less than 2 seconds.  Neurological:     General: No focal deficit present.     Mental Status: He is oriented to person, place, and time.  Psychiatric:        Mood and Affect: Mood normal.        Behavior: Behavior normal.        Thought Content: Thought content normal.        Judgment: Judgment normal.     Results for orders placed or performed in visit on 08/21/23  Microalbumin, Urine Waived (STAT)   Collection Time: 08/21/23 10:38 AM  Result Value Ref Range   Microalb, Ur Waived 30 (H) 0 - 19 mg/L   Creatinine, Urine Waived 100 10 - 300 mg/dL   Microalb/Creat Ratio <30 <30 mg/g  HgB A1c   Collection Time: 08/21/23 10:46 AM  Result Value Ref Range   Hgb A1c MFr Bld 6.3 (H) 4.8 - 5.6 %   Est. average glucose Bld gHb Est-mCnc 134 mg/dL  Lipid Profile   Collection Time: 08/21/23 10:46 AM  Result Value Ref Range   Cholesterol, Total 181 100 - 199 mg/dL   Triglycerides 861 0 - 149 mg/dL   HDL 44 >60 mg/dL   VLDL Cholesterol Cal 25 5 - 40 mg/dL   LDL Chol Calc (NIH) 887 (H) 0 - 99 mg/dL   Chol/HDL Ratio 4.1 0.0 - 5.0 ratio  Sed Rate (ESR)   Collection Time: 08/21/23 10:46 AM  Result Value Ref Range   Sed Rate 18 0 - 30 mm/hr  C-reactive protein   Collection Time: 08/21/23 10:46 AM  Result Value Ref Range   CRP 9 0 - 10 mg/L   Rheumatoid factor   Collection Time: 08/21/23 10:46 AM  Result Value Ref Range   Rheumatoid fact SerPl-aCnc 80.2 (H) <14.0 IU/mL  ANA   Collection Time: 08/21/23 10:46 AM  Result Value Ref Range   Anti Nuclear Antibody (ANA) Negative Negative  ANA+ENA+DNA/DS+Antich+Centr   Collection Time: 08/21/23 10:46 AM  Result Value Ref Range   ANA Titer 1 Negative    dsDNA Ab <1 0 - 9 IU/mL   ENA RNP  Ab 0.6 0.0 - 0.9 AI   ENA SM Ab Ser-aCnc <0.2 0.0 - 0.9 AI   Scleroderma (Scl-70) (ENA) Antibody, IgG <0.2 0.0 - 0.9 AI   ENA SSA (RO) Ab <0.2 0.0 - 0.9 AI   ENA SSB (LA) Ab <0.2 0.0 - 0.9 AI   Chromatin Ab SerPl-aCnc <0.2 0.0 - 0.9 AI   Anti JO-1 <0.2 0.0 - 0.9 AI   Centromere Ab Screen <0.2 0.0 - 0.9 AI   See below: Comment   CK   Collection Time: 08/21/23 10:46 AM  Result Value Ref Range   Total CK 114 41 - 331 U/L  Uric acid   Collection Time: 08/21/23 10:46 AM  Result Value Ref Range   Uric Acid 8.0 3.8 - 8.4 mg/dL  Ehrlichia antibody panel   Collection Time: 08/21/23 10:46 AM  Result Value Ref Range   E.Chaffeensis (HME) IgG Negative Neg:<1:64   E. Chaffeensis (HME) IgM Titer Negative Neg:<1:20   HGE IgG Titer Negative Neg:<1:64   HGE IgM Titer Negative Neg:<1:20   Result Comment: Comment   CBC with Differential/Platelet   Collection Time: 08/21/23 10:46 AM  Result Value Ref Range   WBC 12.6 (H) 3.4 - 10.8 x10E3/uL   RBC 5.27 4.14 - 5.80 x10E6/uL   Hemoglobin 16.0 13.0 - 17.7 g/dL   Hematocrit 50.5 62.4 - 51.0 %   MCV 94 79 - 97 fL   MCH 30.4 26.6 - 33.0 pg   MCHC 32.4 31.5 - 35.7 g/dL   RDW 86.5 88.3 - 84.5 %   Platelets 334 150 - 450 x10E3/uL   Neutrophils 53 Not Estab. %   Lymphs 34 Not Estab. %   Monocytes 7 Not Estab. %   Eos 5 Not Estab. %   Basos 1 Not Estab. %   Neutrophils Absolute 6.7 1.4 - 7.0 x10E3/uL   Lymphocytes Absolute 4.2 (H) 0.7 - 3.1 x10E3/uL   Monocytes Absolute 0.9 0.1 - 0.9 x10E3/uL   EOS (ABSOLUTE) 0.6 (H) 0.0 - 0.4 x10E3/uL   Basophils  Absolute 0.1 0.0 - 0.2 x10E3/uL   Immature Granulocytes 0 Not Estab. %   Immature Grans (Abs) 0.0 0.0 - 0.1 x10E3/uL  Comprehensive metabolic panel   Collection Time: 08/21/23 10:46 AM  Result Value Ref Range   Glucose 88 70 - 99 mg/dL   BUN 17 6 - 24 mg/dL   Creatinine, Ser 9.10 0.76 - 1.27 mg/dL   eGFR 897 >40 fO/fpw/8.26   BUN/Creatinine Ratio 19 9 - 20   Sodium 139 134 - 144 mmol/L   Potassium 3.6 3.5 - 5.2 mmol/L   Chloride 100 96 - 106 mmol/L   CO2 26 20 - 29 mmol/L   Calcium 9.7 8.7 - 10.2 mg/dL   Total Protein 6.3 6.0 - 8.5 g/dL   Albumin 4.2 3.8 - 4.9 g/dL   Globulin, Total 2.1 1.5 - 4.5 g/dL   Bilirubin Total 0.4 0.0 - 1.2 mg/dL   Alkaline Phosphatase 82 44 - 121 IU/L   AST 25 0 - 40 IU/L   ALT 23 0 - 44 IU/L  TSH   Collection Time: 08/21/23 10:46 AM  Result Value Ref Range   TSH 1.490 0.450 - 4.500 uIU/mL  PSA   Collection Time: 08/21/23 10:46 AM  Result Value Ref Range   Prostate Specific Ag, Serum 1.1 0.0 - 4.0 ng/mL  Anti-CCP Ab, IgG + IgA (RDL)   Collection Time: 08/21/23 10:46 AM  Result Value Ref Range   Anti-CCP Ab,  IgG + IgA (RDL) <20 <20 Units  Specimen status report   Collection Time: 08/21/23 10:46 AM  Result Value Ref Range   specimen status report Comment   Spotted Fever Group Antibodies   Collection Time: 08/21/23  1:17 PM  Result Value Ref Range   Spotted Fever Group IgG <1:64 Neg:<1:64   Spotted Fever Group IgM <1:64 Neg:<1:64   Result Comment Comment       Assessment & Plan:   Problem List Items Addressed This Visit   None Visit Diagnoses       Acute non-recurrent sinusitis, unspecified location    -  Primary   Amoxicillin BID x10 days. Continue with flonase, take OTC coricidin if needed for congestion. Follow-up as needed if symptoms worsen or do not resolve.        Follow up plan: No follow-ups on file.

## 2024-03-17 ENCOUNTER — Ambulatory Visit: Payer: Self-pay | Admitting: Nurse Practitioner

## 2024-03-30 ENCOUNTER — Ambulatory Visit: Admitting: Pediatrics

## 2024-03-30 NOTE — Progress Notes (Deleted)
 Office Visit Note  Patient: Perry Hansen             Date of Birth: [birth year]           MRN: [id number]             PCP: Herold Hadassah SQUIBB, MD Referring: Herold Hadassah SQUIBB, MD Visit Date: 04/13/2024 Occupation: Data Unavailable  Subjective:  No chief complaint on file.   History of Present Illness: Perry Hansen is a 53 y.o. male ***     Activities of Daily Living:  Patient reports morning stiffness for *** {minute/hour:19697}.   Patient {ACTIONS;DENIES/REPORTS:21021675::Denies} nocturnal pain.  Difficulty dressing/grooming: {ACTIONS;DENIES/REPORTS:21021675::Denies} Difficulty climbing stairs: {ACTIONS;DENIES/REPORTS:21021675::Denies} Difficulty getting out of chair: {ACTIONS;DENIES/REPORTS:21021675::Denies} Difficulty using hands for taps, buttons, cutlery, and/or writing: {ACTIONS;DENIES/REPORTS:21021675::Denies}  No Rheumatology ROS completed.   PMFS History:  Patient Active Problem List   Diagnosis Date Noted   Chronic right-sided low back pain without sciatica 01/21/2024   Daytime somnolence 08/31/2023   Polyarthralgia 08/31/2023   Primary insomnia 11/06/2021   Gastroesophageal reflux disease 11/01/2021   Muscle spasm of back 11/01/2021   Hypertriglyceridemia 12/21/2019   Diet-controlled diabetes mellitus (HCC) 11/01/2019   Essential hypertension 04/11/2015   Chronic urticaria 08/18/2013    Past Medical History:  Diagnosis Date   Arthritis of knee, left 11/01/2021   Chronic urticaria    Diabetes mellitus without complication (HCC)    Difficulty controlling anger 11/01/2021   Edema    GERD (gastroesophageal reflux disease)    History of kidney stones    Hypertension    Nocturia more than twice per night 11/01/2021   Obesity (BMI 30-39.9) 11/01/2019   Other specified arthritis, right shoulder 11/01/2021   Tobacco use disorder 08/18/2013    Family History  Problem Relation Age of Onset   Hypertension Mother    Lung cancer Mother    Seizures  Mother    Stroke Father    Heart disease Father    Diabetes Father    Diabetes Sister    Lung cancer Maternal Grandmother    Diabetes Paternal Grandmother    Heart disease Paternal Grandfather    Past Surgical History:  Procedure Laterality Date   ABDOMINAL SURGERY  1975   surgery on stomach as a kid because he swallowed a marble   ANTERIOR CRUCIATE LIGAMENT REPAIR Left 03/02/2019   Procedure: LEFT KNEE ARTHROSCOPY,LEFT KNEE ANTERIOR CRUCIATE LIGAMENT RECONSTRUCTION WITH AUTOGRAFT AND MEDIAL MENISCAL REPAIR;  Surgeon: Marchia Drivers, MD;  Location: [facility] ORS;  Service: Orthopedics;  Laterality: Left;   KNEE ARTHROSCOPY WITH MEDIAL MENISECTOMY Left 03/28/2020   Procedure: LEFT KNEE ARTHROSCOPY WITH PARTIAL MEDIAL MENISECTOMY, CHONDROPLASTY MEDIAL FEMORAL CHONDYLE, EXCISION OF LOOSE BODIES;  Surgeon: Marchia Drivers, MD;  Location: [facility] OR[facility];  Service: Orthopedics;  Laterality: Left;   SHOULDER ARTHROSCOPY Right 2010   Social History   Tobacco Use   Smoking status: Every Day    Current packs/day: 1.00    Average packs/day: 1 pack/day for 32.0 years (32.0 ttl pk-yrs)    Types: Cigarettes   Smokeless tobacco: Never  Vaping Use   Vaping status: Never Used  Substance Use Topics   Alcohol use: Never   Drug use: Never   Social History   Social History Narrative   Not on file      There is no immunization history on file for this patient.   Objective: Vital Signs: There were no vitals taken for this visit.   Physical Exam   Musculoskeletal Exam: ***  CDAI Exam: CDAI Score: -- Patient Global: --; Provider Global: -- Swollen: --; Tender: -- Joint Exam 04/13/2024   No joint exam has been documented for this visit   There is currently no information documented on the homunculus. Go to the Rheumatology activity and complete the homunculus joint exam.  Investigation: No additional findings.  Imaging: No results found.  Recent Labs: Lab Results  Component Value  Date   WBC 12.6 (H) 08/21/2023   HGB 16.0 08/21/2023   PLT 334 08/21/2023   NA 139 08/21/2023   K 3.6 08/21/2023   CL 100 08/21/2023   CO2 26 08/21/2023   GLUCOSE 88 08/21/2023   BUN 17 08/21/2023   CREATININE 0.89 08/21/2023   BILITOT 0.4 08/21/2023   ALKPHOS 82 08/21/2023   AST 25 08/21/2023   ALT 23 08/21/2023   PROT 6.3 08/21/2023   ALBUMIN 4.2 08/21/2023   CALCIUM 9.7 08/21/2023   GFRAA >60 02/24/2019   March 16, 2024 influenza A and B-, COVID-19 antigen negative  August 21, 2023 ANA negative, ENA panel negative, Jo 1 negative, sed rate 18, CRP 9, RF 80.2, anti-CCP<20, uric acid 8.0, CK114, hemoglobin A1c 6.3, RMSF negative, early ketosis negative, LDL 112   Speciality Comments: No specialty comments available.  Procedures:  No procedures performed Allergies: Meloxicam and Naproxen   Assessment / Plan:     Visit Diagnoses: No diagnosis found.  Orders: No orders of the defined types were placed in this encounter.  No orders of the defined types were placed in this encounter.   Face-to-face time spent with patient was *** minutes. Greater than 50% of time was spent in counseling and coordination of care.  Follow-Up Instructions: No follow-ups on file.   Maya Nash, MD  Note - This record has been created using Animal nutritionist.  Chart creation errors have been sought, but may not always  have been located. Such creation errors do not reflect on  the standard of medical care.

## 2024-04-11 ENCOUNTER — Other Ambulatory Visit: Payer: Self-pay | Admitting: Pediatrics

## 2024-04-11 DIAGNOSIS — I10 Essential (primary) hypertension: Secondary | ICD-10-CM

## 2024-04-13 ENCOUNTER — Encounter: Admitting: Rheumatology

## 2024-04-13 DIAGNOSIS — G8929 Other chronic pain: Secondary | ICD-10-CM

## 2024-04-13 DIAGNOSIS — F5101 Primary insomnia: Secondary | ICD-10-CM

## 2024-04-13 DIAGNOSIS — R400 Somnolence: Secondary | ICD-10-CM

## 2024-04-13 DIAGNOSIS — E781 Pure hyperglyceridemia: Secondary | ICD-10-CM

## 2024-04-13 DIAGNOSIS — I10 Essential (primary) hypertension: Secondary | ICD-10-CM

## 2024-04-13 DIAGNOSIS — K219 Gastro-esophageal reflux disease without esophagitis: Secondary | ICD-10-CM

## 2024-04-13 DIAGNOSIS — R7689 Other specified abnormal immunological findings in serum: Secondary | ICD-10-CM

## 2024-04-13 DIAGNOSIS — E119 Type 2 diabetes mellitus without complications: Secondary | ICD-10-CM

## 2024-04-13 DIAGNOSIS — L508 Other urticaria: Secondary | ICD-10-CM

## 2024-04-13 DIAGNOSIS — M2550 Pain in unspecified joint: Secondary | ICD-10-CM

## 2024-04-13 NOTE — Telephone Encounter (Signed)
 Courtesy refill given, appointment needed.   Requested Prescriptions  Pending Prescriptions Disp Refills   olmesartan  (BENICAR ) 20 MG tablet [Pharmacy Med Name: Olmesartan  Medoxomil 20 MG Oral Tablet] 30 tablet 0    Sig: Take 1 tablet (20 mg total) by mouth daily. OFFICE VISIT NEEDED FOR ADDITIONAL REFILLS     Cardiovascular:  Angiotensin Receptor Blockers Failed - 04/13/2024 11:43 AM      Failed - Cr in normal range and within 180 days    Creatinine  Date Value Ref Range Status  01/18/2012 1.48 (H) 0.60 - 1.30 mg/dL Final   Creatinine, Ser  Date Value Ref Range Status  08/21/2023 0.89 0.76 - 1.27 mg/dL Final         Failed - K in normal range and within 180 days    Potassium  Date Value Ref Range Status  08/21/2023 3.6 3.5 - 5.2 mmol/L Final  01/18/2012 3.4 (L) 3.5 - 5.1 mmol/L Final         Passed - Patient is not pregnant      Passed - Last BP in normal range    BP Readings from Last 1 Encounters:  03/16/24 137/85         Passed - Valid encounter within last 6 months    Recent Outpatient Visits           4 weeks ago Acute non-recurrent sinusitis, unspecified location   [place] [facility] Melvin Pao, NP   2 months ago Essential hypertension   [place] [facility] Herold Hadassah SQUIBB, MD   6 months ago Lumbar back pain   [place] Herold Hadassah SQUIBB, MD   7 months ago Essential hypertension   [place] [facility] Herold Hadassah SQUIBB, MD   7 months ago Type 2 diabetes mellitus without complication, without long-term current use of insulin [facility])   [place] [facility] Herold Hadassah SQUIBB, MD

## 2024-05-04 ENCOUNTER — Ambulatory Visit: Admitting: Rheumatology

## 2024-05-14 ENCOUNTER — Other Ambulatory Visit: Payer: Self-pay | Admitting: Pediatrics

## 2024-05-14 DIAGNOSIS — I10 Essential (primary) hypertension: Secondary | ICD-10-CM

## 2024-05-18 ENCOUNTER — Telehealth: Payer: Self-pay | Admitting: Pediatrics

## 2024-05-18 ENCOUNTER — Telehealth: Payer: Self-pay

## 2024-05-18 DIAGNOSIS — L508 Other urticaria: Secondary | ICD-10-CM

## 2024-05-18 DIAGNOSIS — I10 Essential (primary) hypertension: Secondary | ICD-10-CM

## 2024-05-18 NOTE — Telephone Encounter (Signed)
 Copied from CRM [id number]. Topic: Clinical - Medication Refill >> May 18, 2024  3:36 PM Antwanette L wrote: Medication: olmesartan  (BENICAR ) 20 MG tablet  predniSONE  (DELTASONE ) 10 MG tablet   Has the patient contacted their pharmacy? Yes  This is the patient's preferred pharmacy:  [facility] [address] [place] - [address] [place] [address] Phone: [phone] Fax: [phone]  Is this the correct pharmacy for this prescription? Yes   Has the prescription been filled recently? Yes. Last refill on olmesartan  was 04/13/24 and prednisone  was refilled on 01/21/24  Is the patient out of the medication? No.Patient 4 day supply left of olmesartan  and completely out of prednisone   Has the patient been seen for an appointment in the last year OR does the patient have an upcoming appointment? Yes. Last ov with Dr. Herold was on 01/21/24  Can we respond through MyChart? No. Contact the patient by phone at [phone]  Agent: Please be advised that Rx refills may take up to 3 business days. We ask that you follow-up with your pharmacy. >> May 18, 2024  3:41 PM Antwanette L wrote: The patient is requesting a 90 day supply for both medications

## 2024-05-18 NOTE — Telephone Encounter (Unsigned)
 Copied from CRM [id number]. Topic: Clinical - Medication Refill >> May 18, 2024  3:36 PM Antwanette L wrote: Medication: olmesartan  (BENICAR ) 20 MG tablet  predniSONE  (DELTASONE ) 10 MG tablet   Has the patient contacted their pharmacy? Yes  This is the patient's preferred pharmacy:  [facility] [address], [place] - [address] [place] [address] Phone: [phone] Fax: [phone]  Is this the correct pharmacy for this prescription? Yes   Has the prescription been filled recently? Yes. Last refill on olmesartan  was 04/13/24 and prednisone  was refilled on 01/21/24  Is the patient out of the medication? No.Patient 4 day supply left of olmesartan  and completely out of prednisone   Has the patient been seen for an appointment in the last year OR does the patient have an upcoming appointment? Yes. Last ov with Dr. Herold was on 01/21/24  Can we respond through MyChart? No. Contact the patient by phone at [phone]  Agent: Please be advised that Rx refills may take up to 3 business days. We ask that you follow-up with your pharmacy.

## 2024-05-18 NOTE — Telephone Encounter (Signed)
 Requested Prescriptions  Pending Prescriptions Disp Refills   olmesartan  (BENICAR ) 20 MG tablet [Pharmacy Med Name: Olmesartan  Medoxomil 20 MG Oral Tablet] 90 tablet 0    Sig: TAKE 1 TABLET BY MOUTH ONCE DAILY *OFFICE  VISIT  NEEDED  FOR  ADDITIONAL  REFILLS*     Cardiovascular:  Angiotensin Receptor Blockers Failed - 05/18/2024  3:52 PM      Failed - Cr in normal range and within 180 days    Creatinine  Date Value Ref Range Status  01/18/2012 1.48 (H) 0.60 - 1.30 mg/dL Final   Creatinine, Ser  Date Value Ref Range Status  08/21/2023 0.89 0.76 - 1.27 mg/dL Final         Failed - K in normal range and within 180 days    Potassium  Date Value Ref Range Status  08/21/2023 3.6 3.5 - 5.2 mmol/L Final  01/18/2012 3.4 (L) 3.5 - 5.1 mmol/L Final         Passed - Patient is not pregnant      Passed - Last BP in normal range    BP Readings from Last 1 Encounters:  03/16/24 137/85         Passed - Valid encounter within last 6 months    Recent Outpatient Visits           2 months ago Acute non-recurrent sinusitis, unspecified location   [place] [facility] Melvin Pao, NP   3 months ago Essential hypertension   [place] [facility] Herold Hadassah SQUIBB, MD   7 months ago Lumbar back pain   [place] [facility] Herold Hadassah SQUIBB, MD   8 months ago Essential hypertension   [place] [facility] Herold Hadassah SQUIBB, MD   9 months ago Type 2 diabetes mellitus without complication, without long-term current use of insulin [facility])   [employer] [facility] Herold Hadassah SQUIBB, MD

## 2024-05-19 ENCOUNTER — Other Ambulatory Visit: Payer: Self-pay | Admitting: Pediatrics

## 2024-05-19 DIAGNOSIS — L508 Other urticaria: Secondary | ICD-10-CM

## 2024-05-19 MED ORDER — PREDNISONE 10 MG PO TABS: 10.0000 mg | ORAL_TABLET | Freq: Every day | ORAL | 3 refills | Status: DC | PRN

## 2024-05-19 MED ORDER — PREDNISONE 10 MG PO TABS: 10.0000 mg | ORAL_TABLET | Freq: Every day | ORAL | 3 refills | Status: AC | PRN

## 2024-05-19 NOTE — Telephone Encounter (Signed)
 Olmesartan  was sent in yesterday. Can Prednisone  be sent in for the patient? RX in chart states to take PRN for hives.

## 2024-05-21 NOTE — Telephone Encounter (Signed)
 Medications were refilled on 05/19/24 in separate encounter

## 2024-06-18 ENCOUNTER — Ambulatory Visit: Payer: Self-pay | Admitting: Pediatrics

## 2024-06-18 NOTE — Telephone Encounter (Signed)
 FYI Only or Action Required?: Action required by provider: medication refill request.  Patient was last seen in primary care on 03/16/2024 by Melvin Pao, NP.  Called Nurse Triage reporting Urticaria.  Symptoms began chronic.  Interventions attempted: Prescription medications: prednisone  works.out currently  Symptoms are: gradually worsening.  Triage Disposition: See Physician Within 24 Hours  Patient/caregiver understands and will follow disposition?: No, wishes to speak with PCP        Copied from CRM #[id number]. Topic: Clinical - Red Word Triage >> Jun 18, 2024 10:45 AM Amy B wrote: Red Word that prompted transfer to Nurse Triage: Urticaria, shortness of breath, Reason for Disposition  [1] MODERATE-SEVERE hives (e.g.,hives interfere with normal activities or work) AND [2] not improved after taking antihistamine (e.g., cetirizine, fexofenadine, or loratadine) > 24 hours  Answer Assessment - Initial Assessment Questions Pt is calling in for refill of his prednisone  for urticaria. CRM states shortness of breath also, pt denies. Pt states he has chronic urticaria x15 years and he takes 10mg  prednisone  as needed. RN advised script sent in 05/19/24 with 3 refill, he states his pharmacy is saying he does not have any refills available. Pt denies any higher acuity symptoms. He is requesting a call back when this is completed so he can send his wife to get the medication. He states it is ok to leave the information on his voicemail.     1. APPEARANCE: What does the rash look like?      hives 2. LOCATION: Where is the rash located?      generalized 3. NUMBER: How many hives are there?       4. SIZE: How big are the hives? (e.g., inches, cm, compare to coins) Do they all look the same or do they vary in shape and size?       5. ONSET: When did the hives begin? (e.g., hours or days ago)      Chronic but getting worse without prednisone  6. ITCHING: Does it itch? If Yes,  ask: How bad is the itch?  (e.g., none, mild, moderate, severe)     yes 7. RECURRENT PROBLEM: Have you had hives before? If Yes, ask: When was the last time? and What happened that time?      yes 8. TRIGGERS: Were you exposed to any new food, plant, cosmetic product or animal just before the hives began?     The cold makes it worse 9. OTHER SYMPTOMS: Do you have any other symptoms? (e.g., fever, tongue swelling, difficulty breathing, abdomen pain)     Denies  Protocols used: Hives-A-AH

## 2024-06-23 NOTE — Telephone Encounter (Signed)
 Patient picked up a refill on 06/18/2024.

## 2024-06-23 NOTE — Telephone Encounter (Signed)
TOC scheduled

## 2024-06-23 NOTE — Telephone Encounter (Signed)
 Called patient and left a message to call back to get scheduled for a TOC to one of our other providers since Dr Herold has left.

## 2024-06-30 ENCOUNTER — Ambulatory Visit

## 2024-10-28 ENCOUNTER — Encounter: Admitting: Nurse Practitioner
